# Patient Record
Sex: Female | Born: 1985
Health system: Southern US, Community
[De-identification: ages and names within clinical notes are randomized; demographics above are authoritative.]

## PROBLEM LIST (undated history)

## (undated) ENCOUNTER — Inpatient Hospital Stay (HOSPITAL_COMMUNITY): Payer: Self-pay

## (undated) DIAGNOSIS — G8929 Other chronic pain: Secondary | ICD-10-CM

## (undated) DIAGNOSIS — O24419 Gestational diabetes mellitus in pregnancy, unspecified control: Secondary | ICD-10-CM

## (undated) DIAGNOSIS — R51 Headache: Secondary | ICD-10-CM

## (undated) DIAGNOSIS — R519 Headache, unspecified: Secondary | ICD-10-CM

## (undated) DIAGNOSIS — T7840XA Allergy, unspecified, initial encounter: Secondary | ICD-10-CM

## (undated) DIAGNOSIS — K219 Gastro-esophageal reflux disease without esophagitis: Secondary | ICD-10-CM

## (undated) HISTORY — DX: Other chronic pain: G89.29

## (undated) HISTORY — DX: Headache, unspecified: R51.9

## (undated) HISTORY — DX: Allergy, unspecified, initial encounter: T78.40XA

## (undated) HISTORY — DX: Headache: R51

## (undated) HISTORY — DX: Gastro-esophageal reflux disease without esophagitis: K21.9

## (undated) HISTORY — DX: Gestational diabetes mellitus in pregnancy, unspecified control: O24.419

---

## 2012-09-17 ENCOUNTER — Encounter (HOSPITAL_COMMUNITY): Payer: Self-pay | Admitting: *Deleted

## 2012-09-17 ENCOUNTER — Emergency Department (HOSPITAL_COMMUNITY)
Admission: EM | Admit: 2012-09-17 | Discharge: 2012-09-17 | Disposition: A | Discharge status: Home / Self Care | Payer: Self-pay | Attending: Emergency Medicine | Admitting: Emergency Medicine

## 2012-09-17 DIAGNOSIS — T7840XA Allergy, unspecified, initial encounter: Secondary | ICD-10-CM

## 2012-09-17 DIAGNOSIS — F17200 Nicotine dependence, unspecified, uncomplicated: Secondary | ICD-10-CM | POA: Insufficient documentation

## 2012-09-17 DIAGNOSIS — L250 Unspecified contact dermatitis due to cosmetics: Secondary | ICD-10-CM | POA: Insufficient documentation

## 2012-09-17 MED ORDER — FAMOTIDINE 40 MG PO TABS: 20.0000 mg | ORAL_TABLET | Freq: Two times a day (BID) | ORAL | Status: DC

## 2012-09-17 MED ORDER — FAMOTIDINE 20 MG PO TABS
40.0000 mg | ORAL_TABLET | Freq: Once | ORAL | Status: AC
  Administered 2012-09-17: 40 mg via ORAL
  Filled 2012-09-17: qty 2

## 2012-09-17 MED ORDER — PREDNISONE 20 MG PO TABS: 40.0000 mg | ORAL_TABLET | Freq: Every day | ORAL | Status: DC

## 2012-09-17 MED ORDER — PREDNISONE 20 MG PO TABS
60.0000 mg | ORAL_TABLET | Freq: Once | ORAL | Status: AC
  Administered 2012-09-17: 60 mg via ORAL
  Filled 2012-09-17: qty 3

## 2012-09-17 NOTE — ED Provider Notes (Addendum)
History     CSN: [id number]  Arrival date & time 09/17/12  7829   First MD Initiated Contact with Patient 09/17/12 2131      Chief Complaint  Patient presents with  . Allergic Reaction    (Consider location/radiation/quality/duration/timing/severity/associated sxs/prior treatment) HPI Comments: She broke out with generalized hives, proximally, 4 AM this was after he tripped to the mall with her sister and a sampled.  Several new cosmetics.  She has had a history of a similar type of reaction.  Several years ago.  Patient denies any shortness of breath, difficulty swallowing.  Has not taken any over-the-counter medications prior to arrival  Patient is a 26 y.o. female presenting with allergic reaction. The history is provided by the patient.  Allergic Reaction The primary symptoms are  rash and urticaria. The primary symptoms do not include wheezing, nausea or dizziness.    History reviewed. No pertinent past medical history.  History reviewed. No pertinent past surgical history.  No family history on file.  History  Substance Use Topics  . Smoking status: Current Every Day Smoker    Types: Cigarettes  . Smokeless tobacco: Not on file  . Alcohol Use: No    OB History    Grav Para Term Preterm Abortions TAB SAB Ect Mult Living                  Review of Systems  Constitutional: Negative for fever and chills.  Respiratory: Negative for wheezing.   Gastrointestinal: Negative for nausea.  Skin: Positive for rash. Negative for wound.  Neurological: Negative for dizziness, weakness and headaches.    Allergies  Review of patient's allergies indicates no known allergies.  Home Medications   Current Outpatient Rx  Name Route Sig Dispense Refill  . FAMOTIDINE 40 MG PO TABS Oral Take 0.5 tablets (20 mg total) by mouth 2 (two) times daily. 10 tablet 0  . PREDNISONE 20 MG PO TABS Oral Take 2 tablets (40 mg total) by mouth daily. 10 tablet 0    BP 102/75  Pulse 110   Temp 97.4 F (36.3 C) (Oral)  Resp 18  SpO2 97%  Physical Exam  Constitutional: She appears well-developed and well-nourished.  HENT:  Head: Normocephalic.  Eyes: Pupils are equal, round, and reactive to light.  Neck: Normal range of motion.  Cardiovascular: Normal rate.   Pulmonary/Chest: Effort normal. No respiratory distress. She has no wheezes. She has no rales.  Musculoskeletal: Normal range of motion.  Skin: Skin is warm. Rash noted.    ED Course  Procedures (including critical care time)  Labs Reviewed - No data to display No results found.   1. Allergic reaction       MDM   Visually generalized hives, no shortness of breath, difficulty swallowing.  She was given steroids before my examination, she took 25 mg a Benadryl, but is still having significant, pured, does have given her 40 mg of Pepcid by mouth, which seems to have decreased.  Her itching, the hives are starting to fade        Arman Filter, NP 09/17/12 2136  Arman Filter, NP 09/17/12 2136  Arman Filter, NP 09/17/12 2136  Arman Filter, NP 09/18/12 0110  Arman Filter, NP 09/18/12 0110  Arman Filter, NP 09/19/12 2012

## 2012-09-17 NOTE — ED Notes (Signed)
Pt tried a new perfume yesterday and at 4 am this am pt awoke with hives.  She has taken 25 mg of benadryl x 2 with no relief.  No angioedema, but pt is covered in hives from head to toe.  Pt states she is starting to feel dizzy.  Airway intact.

## 2012-09-17 NOTE — ED Notes (Signed)
Pt is improving with prednisone.

## 2012-09-17 NOTE — ED Notes (Signed)
Pt continues to have hives, however, airway remains clear and pt continues to deny sob.

## 2012-09-18 ENCOUNTER — Emergency Department (HOSPITAL_COMMUNITY)
Admission: EM | Admit: 2012-09-18 | Discharge: 2012-09-18 | Disposition: A | Discharge status: Home / Self Care | Payer: Self-pay | Attending: Emergency Medicine | Admitting: Emergency Medicine

## 2012-09-18 ENCOUNTER — Encounter (HOSPITAL_COMMUNITY): Payer: Self-pay | Admitting: Physical Medicine and Rehabilitation

## 2012-09-18 DIAGNOSIS — L299 Pruritus, unspecified: Secondary | ICD-10-CM | POA: Insufficient documentation

## 2012-09-18 DIAGNOSIS — F17200 Nicotine dependence, unspecified, uncomplicated: Secondary | ICD-10-CM | POA: Insufficient documentation

## 2012-09-18 DIAGNOSIS — R21 Rash and other nonspecific skin eruption: Secondary | ICD-10-CM | POA: Insufficient documentation

## 2012-09-18 DIAGNOSIS — T4995XA Adverse effect of unspecified topical agent, initial encounter: Secondary | ICD-10-CM | POA: Insufficient documentation

## 2012-09-18 DIAGNOSIS — T7840XA Allergy, unspecified, initial encounter: Secondary | ICD-10-CM

## 2012-09-18 MED ORDER — DIPHENHYDRAMINE HCL 50 MG/ML IJ SOLN
50.0000 mg | Freq: Once | INTRAMUSCULAR | Status: AC
  Administered 2012-09-18: 50 mg via INTRAMUSCULAR
  Filled 2012-09-18: qty 1

## 2012-09-18 MED ORDER — HYDROXYZINE HCL 25 MG PO TABS: 25.0000 mg | ORAL_TABLET | Freq: Four times a day (QID) | ORAL | Status: DC

## 2012-09-18 NOTE — ED Notes (Signed)
Pt presents to department for evaluation of generalized rash and hives all over body. Pt states she was seen for same yesterday, but itching and discomfort became worse today. Also states "I feel like my throat is closing up.' received prescriptions for pepcid and prednisone last night. Respirations unlabored at the time. She is alert and oriented x4.

## 2012-09-18 NOTE — ED Provider Notes (Signed)
History   This chart was scribed for Toy Baker, MD by Charolett Bumpers . The patient was seen in room TR09C/TR09C. Patient's care was started at 1825.   CSN: [id number] Arrival date & time 09/18/12  1712  First MD Initiated Contact with Patient 09/18/12 1825      Chief Complaint  Patient presents with  . Rash  . Allergic Reaction    The history is provided by the patient. No language interpreter was used.   Lanny Frakes is a 26 y.o. female who presents to the Emergency Department complaining of constant, moderate generalized itchy rash to most of her body. She was seen here last night for a rash that was thought due to a new perfume. She reports she was given Pepcid and Prednisone last night that she got filled. She denies taking any Benadryl at home but received a benadryl injection her in ED with relief of itching. Husband states the itching worsened earlier today. Husband denies any difficulty breathing or swallowing.   No past medical history on file.  No past surgical history on file.  No family history on file.  History  Substance Use Topics  . Smoking status: Current Every Day Smoker    Types: Cigarettes  . Smokeless tobacco: Not on file  . Alcohol Use: No    OB History    Grav Para Term Preterm Abortions TAB SAB Ect Mult Living                  Review of Systems  Constitutional: Negative for fever and chills.  HENT: Negative for trouble swallowing.   Respiratory: Negative for shortness of breath and stridor.   Gastrointestinal: Negative for nausea and vomiting.  Skin: Positive for rash.  Neurological: Negative for weakness.  All other systems reviewed and are negative.    Allergies  Review of patient's allergies indicates no known allergies.  Home Medications   Current Outpatient Rx  Name Route Sig Dispense Refill  . FAMOTIDINE 40 MG PO TABS Oral Take 0.5 tablets (20 mg total) by mouth 2 (two) times daily. 10 tablet 0  . PREDNISONE 20 MG  PO TABS Oral Take 2 tablets (40 mg total) by mouth daily. 10 tablet 0    BP 125/86  Pulse 100  Temp 98.1 F (36.7 C) (Oral)  Resp 20  SpO2 98%  Physical Exam  Nursing note and vitals reviewed. Constitutional: She is oriented to person, place, and time. She appears well-developed and well-nourished.  Non-toxic appearance. No distress.  HENT:  Head: Normocephalic and atraumatic.  Mouth/Throat: Oropharynx is clear and moist. No oropharyngeal exudate.       Airway appears normal.   Eyes: Conjunctivae normal, EOM and lids are normal. Pupils are equal, round, and reactive to light.  Neck: Normal range of motion. Neck supple. No tracheal deviation present. No mass present.       No stridor.   Cardiovascular: Normal rate, regular rhythm and normal heart sounds.  Exam reveals no gallop.   No murmur heard. Pulmonary/Chest: Effort normal and breath sounds normal. No stridor. No respiratory distress. She has no decreased breath sounds. She has no wheezes. She has no rhonchi. She has no rales.  Abdominal: Soft. Normal appearance and bowel sounds are normal. She exhibits no distension. There is no tenderness. There is no rebound and no CVA tenderness.  Musculoskeletal: Normal range of motion. She exhibits no edema and no tenderness.  Neurological: She is alert and oriented to person, place,  and time. She has normal strength. No cranial nerve deficit or sensory deficit. GCS eye subscore is 4. GCS verbal subscore is 5. GCS motor subscore is 6.  Skin: Skin is warm and dry. Rash noted. No abrasion noted.       Hives on face.   Psychiatric: She has a normal mood and affect. Her speech is normal and behavior is normal.    ED Course  Procedures (including critical care time)  DIAGNOSTIC STUDIES: Oxygen Saturation is 98% on room air, normal by my interpretation.    COORDINATION OF CARE:  18:35-Discussed planned course of treatment with the patient including continuing with Prednisone and Pepcid, who  is agreeable at this time. Will start pt on Hydroxyzine.     Labs Reviewed - No data to display No results found.   No diagnosis found.    MDM  Patient given Benadryl here to 50 mg IM and feels better. Prescribe her Vistaril and given her return instructions   I personally performed the services described in this documentation, which was scribed in my presence. The recorded information has been reviewed and considered.      Toy Baker, MD 09/18/12 [phone]

## 2012-09-19 NOTE — ED Provider Notes (Signed)
Medical screening examination/treatment/procedure(s) were performed by non-physician practitioner and as supervising physician I was immediately available for consultation/collaboration.   Gwyneth Sprout, MD 09/19/12 1324

## 2012-09-20 NOTE — ED Provider Notes (Signed)
Medical screening examination/treatment/procedure(s) were performed by non-physician practitioner and as supervising physician I was immediately available for consultation/collaboration.   Gwyneth Sprout, MD 09/20/12 [phone]

## 2012-09-27 ENCOUNTER — Ambulatory Visit: Payer: Self-pay | Admitting: Family Medicine

## 2012-09-27 VITALS — BP 112/72 | HR 73 | Temp 97.9°F | Resp 16 | Ht 66.5 in | Wt 144.6 lb

## 2012-09-27 DIAGNOSIS — T7840XA Allergy, unspecified, initial encounter: Secondary | ICD-10-CM

## 2012-09-27 MED ORDER — METHYLPREDNISOLONE ACETATE 80 MG/ML IJ SUSP: 80.0000 mg | Freq: Once | INTRAMUSCULAR | Status: DC

## 2012-09-27 MED ORDER — EPINEPHRINE 0.3 MG/0.3ML IJ DEVI: 0.3000 mg | Freq: Once | INTRAMUSCULAR | Status: DC

## 2012-09-27 NOTE — Progress Notes (Signed)
[facility] [address] [place] [address] [phone]  Date:  09/27/2012   Name:  Monique Patrick   DOB:  1986/06/20   MRN:  [id number]  PCP:  Sheila Oats, MD    Chief Complaint: Rash   History of Present Illness:  Monique Patrick is a 26 y.o. very pleasant female patient who presents with the following:  She was seen at the ED on 09/17/12 with an allergic reaction consisting or rash and urticaria. She was noted to have hives and was treated with pepcid and benadryl and an rx for oral prednisone.   She went back to the ED the next day on 10/23 for a recheck.  She was started on hydroxyzine and told to continue her prednisone and pepcid.  She was given a shot of benadryl as well.    She is here with her husband today.  The rash seems to continue- the hives come and go, but they tend to be worse in the morning and at night.  This morning she had a lot of hives. They are gone now.    She took prednisone for 5 days- she finished this about 4 days ago. They were not sure if this helped- she is worse again today.    She had a similar episode when she was 26 years old or so.  However, this was not as severe and resolved without incident Her LMP was about 3 weeks ago.    There is no problem list on file for this patient.   Past Medical History  Diagnosis Date  . Allergy     History reviewed. No pertinent past surgical history.  History  Substance Use Topics  . Smoking status: Never Smoker   . Smokeless tobacco: Not on file  . Alcohol Use: No    History reviewed. No pertinent family history.  No Known Allergies  Medication list has been reviewed and updated.  Current Outpatient Prescriptions on File Prior to Visit  Medication Sig Dispense Refill  . famotidine (PEPCID) 40 MG tablet Take 0.5 tablets (20 mg total) by mouth 2 (two) times daily.  10 tablet  0  . hydrOXYzine (ATARAX/VISTARIL) 25 MG tablet Take 1 tablet (25 mg total) by mouth every 6  (six) hours.  12 tablet  0  . predniSONE (DELTASONE) 20 MG tablet Take 2 tablets (40 mg total) by mouth daily.  10 tablet  0    Review of Systems:  As per HPI- otherwise negative. They have photos of her from this morning with diffuse urticaria over her body  Physical Examination: Filed Vitals:   09/27/12 1327  BP: 112/72  Pulse: 73  Temp: 97.9 F (36.6 C)  Resp: 16   Filed Vitals:   09/27/12 1327  Height: 5' 6.5" (1.689 m)  Weight: 144 lb 9.6 oz (65.59 kg)   Body mass index is 22.99 kg/(m^2). Ideal Body Weight: Weight in (lb) to have BMI = 25: 156.9   GEN: WDWN, NAD, Non-toxic, A & O x 3, wearing traditional African dress of full- length dress and head- wrap.   HEENT: Atraumatic, Normocephalic. Neck supple. No masses, No LAD. Bilateral TM wnl, oropharynx normal.  PEERL,EOMI.  No lip, tongue or palate swelling.  No sign of angioedema.  Ears and Nose: No external deformity. CV: RRR, No M/G/R. No JVD. No thrill. No extra heart sounds. PULM: CTA B, no wheezes, crackles, rhonchi. No retractions. No resp. distress. No accessory muscle use. ABD: S, NT, ND  EXTR: No c/c/e NEURO Normal gait.  PSYCH: Normally interactive. Conversant. Not depressed or anxious appearing.  Calm demeanor.  At this time she has a few small hives on her right thigh but no other active hives on her limbs or trunk   Assessment and Plan: 1. Allergic reaction  methylPREDNISolone acetate (DEPO-MEDROL) injection 80 mg, EPINEPHrine (EPIPEN) 0.3 mg/0.3 mL DEVI   Allergic reaction, unknown cause.  She is frustrated by her persistent symptoms.  No known trigger.  Will try IM depo- medrol.  Otherwise continue anti- histamine treatment as they are doing.  Discussed use of an epi- pen and gave an rx to have on hand.    Abbe Amsterdam, MD

## 2012-09-27 NOTE — Patient Instructions (Addendum)
Let us know if your hives do not resolve in the next few days.  Your might try taking these things: Benadryl 1 or 2 pills every 6 hours as needed (use this OR the hydralazine)  One pepcid a day One claritin or zyrtec a day  Stay cool and calm.   If you have any swelling of your lips, tongue or throat and are having trouble breathing use the epi- pen.  If this occurs you also need to seek care right away- call 911!   If you have any concerns you can always call us

## 2012-10-28 ENCOUNTER — Telehealth: Payer: Self-pay

## 2012-10-28 NOTE — Telephone Encounter (Signed)
Left message for call back.

## 2012-10-28 NOTE — Telephone Encounter (Signed)
[name] STATES HIS WIFE WAS SEEN FOR ALLERGIES AND WAS TOLD TO CALL IF NO BETTER AND SHE IS WORSE PLEASE CALL [phone]

## 2012-10-28 NOTE — Telephone Encounter (Signed)
Please give them a call back- it sounds like they are doing everything they can at home.  She may need another shot and possibly a referral to see an allergist.  Can they come back in?

## 2012-10-28 NOTE — Telephone Encounter (Signed)
PTS HUSBAND CALLING TO SAY SPOUSE HAS ALLERGIES(RASH)BACK AND WAS TOLD TO CALL IF THAT HAPPENED.  BEST PHONE [phone]  PHARMACY [facility]

## 2012-10-28 NOTE — Telephone Encounter (Signed)
Pts husband is calling back to talk with nurse about his wife

## 2012-10-28 NOTE — Telephone Encounter (Signed)
Was seen 09/27/12., she has rash everywhere, including her face, same as before. Now is getting worse. She is taking OTC meds, husband states she is taking Benadryl. I have advised to get Zyrtec to add to this. He said he would, please advise if there is anything else you would like, has been 1 month since last OV.

## 2012-10-28 NOTE — Telephone Encounter (Signed)
Called back- no answer but was able to leave a message.  LMOM stating that it sound like his wife needs to be seen, either by [place] or at the ED.

## 2012-10-30 ENCOUNTER — Ambulatory Visit: Payer: Self-pay | Admitting: Family Medicine

## 2012-10-30 VITALS — BP 102/64 | HR 90 | Temp 97.6°F | Resp 16 | Ht 66.0 in | Wt 142.0 lb

## 2012-10-30 DIAGNOSIS — L509 Urticaria, unspecified: Secondary | ICD-10-CM

## 2012-10-30 DIAGNOSIS — Z609 Problem related to social environment, unspecified: Secondary | ICD-10-CM

## 2012-10-30 DIAGNOSIS — Z79899 Other long term (current) drug therapy: Secondary | ICD-10-CM

## 2012-10-30 DIAGNOSIS — T7840XA Allergy, unspecified, initial encounter: Secondary | ICD-10-CM

## 2012-10-30 DIAGNOSIS — Z789 Other specified health status: Secondary | ICD-10-CM

## 2012-10-30 HISTORY — DX: Urticaria, unspecified: L50.9

## 2012-10-30 LAB — GLUCOSE, POCT (MANUAL RESULT ENTRY): POC Glucose: 91 mg/dl (ref 70–99)

## 2012-10-30 MED ORDER — METHYLPREDNISOLONE ACETATE 80 MG/ML IJ SUSP
80.0000 mg | Freq: Once | INTRAMUSCULAR | Status: AC
  Administered 2012-10-30: 80 mg via INTRAMUSCULAR

## 2012-10-30 MED ORDER — HYDROXYZINE HCL 25 MG PO TABS: 25.0000 mg | ORAL_TABLET | Freq: Four times a day (QID) | ORAL | Status: DC | PRN

## 2012-10-30 MED ORDER — FAMOTIDINE 40 MG PO TABS: 20.0000 mg | ORAL_TABLET | Freq: Two times a day (BID) | ORAL | Status: DC

## 2012-10-30 NOTE — Progress Notes (Signed)
Subjective:    Patient ID: Monique Patrick, female    DOB: 1985-12-26, 26 y.o.   MRN: [id number]  HPI Monique Patrick is a 26 y.o. female  Here with recurrnet pruritic rash.  eval in ER 10/22, then 10/23. Pepcid, atarax, prednisone treatment.  Recurrent 09/27/12 - OV with Dr. Patsy Lager. Depomedrol injection given.  Phone notes reviewed - here for follow up.   Same as before - but current episode seems worse. Started all over body - 6 days ago.  Gets a stomachache initially, then itching started. Feels like going to have a bowel mvmt, but does not. abd pain - still some soreness in abdomen.  All over body - including inside mouth, and redness of eyes.   Itching all over.  No difficulty breathing - but does note sore throat since start of symptoms.  Itching around outside of vagina, but no internal rash.  No dyspnea. Areas in mouth have resolved - none now. Redness and hives on face initially.   Doesn't want to walk because itching on feet. Bump on foot not gone since initial symptoms in October.  tx - did not bring meds - using over the counter cream - anti itch cream of some type. Also taking some liquicap - past 6 days - 2 or 3 per day.  Unknown name of this medicine.  Wasn't helping - no meds since yesterday. No new rx meds/supplements, creams or lotions.   Language barrier - husband translating. Repeated questions multiple times for clarification.   Using water only to bathe.  vaseline only for itchy.   Henna tattoo to feet past 20 days  - never had problem with this in past. Natural substance.  Though may be due to wheat cereal - noted symptoms 1 day after eating a cake with possible wheat in it.   Review of Systems  Eyes: Positive for redness and itching.  Gastrointestinal: Negative for abdominal pain (had prior - none now. ).  Skin: Positive for rash.       Objective:   Physical Exam  Constitutional: She is oriented to person, place, and time. She appears well-developed and  well-nourished.  HENT:  Head: Normocephalic and atraumatic.  Right Ear: External ear normal.  Left Ear: External ear normal.  Mouth/Throat: Uvula is midline, oropharynx is clear and moist and mucous membranes are normal. No oropharyngeal exudate, posterior oropharyngeal edema, posterior oropharyngeal erythema or tonsillar abscesses.       No mucosal lesions identified.   Eyes: Conjunctivae normal and EOM are normal. Pupils are equal, round, and reactive to light. Right eye exhibits no discharge. Left eye exhibits no discharge.  Cardiovascular: Normal rate, regular rhythm, normal heart sounds and intact distal pulses.   Pulmonary/Chest: Effort normal and breath sounds normal.  Abdominal: Soft. Bowel sounds are normal. There is no tenderness.  Neurological: She is alert and oriented to person, place, and time.  Skin: Skin is warm and dry. Rash noted. Rash is urticarial.     Psychiatric: She has a normal mood and affect. Her behavior is normal.   Results for orders placed in visit on 10/30/12  GLUCOSE, POCT (MANUAL RESULT ENTRY)      Component Value Range   POC Glucose 91  70 - 99 mg/dl        Assessment & Plan:  Monique Patrick is a 26 y.o. female 1. Urticaria  Ambulatory referral to Allergy  2. High risk medication use  POCT glucose (manual entry)   Recurrent urticaria - allergic  rxn vs idiopathic urticaria.  Abdominal symptoms likely histamine related. nontender on exam, and no mucosal lesions noted.   Restart pepcid, hydroxyzine, depomedrol injection given (discussed course of prednisone but reported better relief with injection last visit than prednisone pills initially prescribed). Refer to allergist.   RTC and ER precautions discussed.   Patient Instructions  We will refer you to an allergist to evaluate why these hives keep returning.  Take the pepcid twice per day, and the hydroxyzine up to every 6 hours as needed for itching.  Avoid wheat products for now until evaluated by  the specialist.  If your hives do not improve with the injection in the next 2 days, or they return after going away - you may need to be on a longer course of steroids to treat this.  Return to the clinic or go to the nearest emergency room if any of your symptoms worsen or new symptoms occur. If any new ulcers or new rash inside the mouth or in genital area - be evaluated here or in the emergency room right away.  Hives Hives are itchy, red, swollen areas of the skin. They can vary in size and location on your body. Hives can come and go for hours or several days (acute hives) or for several weeks (chronic hives). Hives do not spread from person to person (noncontagious). They may get worse with scratching, exercise, and emotional stress. CAUSES   Allergic reaction to food, additives, or drugs.  Infections, including the common cold.  Illness, such as vasculitis, lupus, or thyroid disease.  Exposure to sunlight, heat, or cold.  Exercise.  Stress.  Contact with chemicals. SYMPTOMS   Red or white swollen patches on the skin. The patches may change size, shape, and location quickly and repeatedly.  Itching.  Swelling of the hands, feet, and face. This may occur if hives develop deeper in the skin. DIAGNOSIS  Your caregiver can usually tell what is wrong by performing a physical exam. Skin or blood tests may also be done to determine the cause of your hives. In some cases, the cause cannot be determined. TREATMENT  Mild cases usually get better with medicines such as antihistamines. Severe cases may require an emergency epinephrine injection. If the cause of your hives is known, treatment includes avoiding that trigger.  HOME CARE INSTRUCTIONS   Avoid causes that trigger your hives.  Take antihistamines as directed by your caregiver to reduce the severity of your hives. Non-sedating or low-sedating antihistamines are usually recommended. Do not drive while taking an  antihistamine.  Take any other medicines prescribed for itching as directed by your caregiver.  Wear loose-fitting clothing.  Keep all follow-up appointments as directed by your caregiver. SEEK MEDICAL CARE IF:   You have persistent or severe itching that is not relieved with medicine.  You have painful or swollen joints. SEEK IMMEDIATE MEDICAL CARE IF:   You have a fever.  Your tongue or lips are swollen.  You have trouble breathing or swallowing.  You feel tightness in the throat or chest.  You have abdominal pain. These problems may be the first sign of a life-threatening allergic reaction. Call your local emergency services (911 in [place] MAKE SURE YOU:   Understand these instructions.  Will watch your condition.  Will get help right away if you are not doing well or get worse. Document Released: 11/13/2005 Document Revised: 05/14/2012 Document Reviewed: 02/06/2012 [facility] Patient Information 2013 [place], [place].

## 2012-10-30 NOTE — Patient Instructions (Addendum)
We will refer you to an allergist to evaluate why these hives keep returning.  Take the pepcid twice per day, and the hydroxyzine up to every 6 hours as needed for itching.  Avoid wheat products for now until evaluated by the specialist.  If your hives do not improve with the injection in the next 2 days, or they return after going away - you may need to be on a longer course of steroids to treat this.  Return to the clinic or go to the nearest emergency room if any of your symptoms worsen or new symptoms occur. If any new ulcers or new rash inside the mouth or in genital area - be evaluated here or in the emergency room right away.  Hives Hives are itchy, red, swollen areas of the skin. They can vary in size and location on your body. Hives can come and go for hours or several days (acute hives) or for several weeks (chronic hives). Hives do not spread from person to person (noncontagious). They may get worse with scratching, exercise, and emotional stress. CAUSES   Allergic reaction to food, additives, or drugs.  Infections, including the common cold.  Illness, such as vasculitis, lupus, or thyroid disease.  Exposure to sunlight, heat, or cold.  Exercise.  Stress.  Contact with chemicals. SYMPTOMS   Red or white swollen patches on the skin. The patches may change size, shape, and location quickly and repeatedly.  Itching.  Swelling of the hands, feet, and face. This may occur if hives develop deeper in the skin. DIAGNOSIS  Your caregiver can usually tell what is wrong by performing a physical exam. Skin or blood tests may also be done to determine the cause of your hives. In some cases, the cause cannot be determined. TREATMENT  Mild cases usually get better with medicines such as antihistamines. Severe cases may require an emergency epinephrine injection. If the cause of your hives is known, treatment includes avoiding that trigger.  HOME CARE INSTRUCTIONS   Avoid causes that  trigger your hives.  Take antihistamines as directed by your caregiver to reduce the severity of your hives. Non-sedating or low-sedating antihistamines are usually recommended. Do not drive while taking an antihistamine.  Take any other medicines prescribed for itching as directed by your caregiver.  Wear loose-fitting clothing.  Keep all follow-up appointments as directed by your caregiver. SEEK MEDICAL CARE IF:   You have persistent or severe itching that is not relieved with medicine.  You have painful or swollen joints. SEEK IMMEDIATE MEDICAL CARE IF:   You have a fever.  Your tongue or lips are swollen.  You have trouble breathing or swallowing.  You feel tightness in the throat or chest.  You have abdominal pain. These problems may be the first sign of a life-threatening allergic reaction. Call your local emergency services (911 in [place] MAKE SURE YOU:   Understand these instructions.  Will watch your condition.  Will get help right away if you are not doing well or get worse. Document Released: 11/13/2005 Document Revised: 05/14/2012 Document Reviewed: 02/06/2012 [facility] Patient Information 2013 [place], [place].

## 2013-11-27 NOTE — L&D Delivery Note (Signed)
Attestation of Attending Supervision of Advanced Practitioner (PA/CNM/NP): Evaluation and management procedures were performed by the Advanced Practitioner under my supervision and collaboration.  I have reviewed the Advanced Practitioner's note and chart, and I agree with the management and plan.  Keileigh Vahey, MD, FACOG Attending Obstetrician & Gynecologist Faculty Practice, [facility]   

## 2013-11-27 NOTE — L&D Delivery Note (Signed)
Delivery Note At 6:48 PM a viable female was delivered via  (Presentation: OA>LOA ).  APGAR: 8, 4; weight 8 lb 10 oz (3912 g).   Placenta status: , .  Cord: 3 vessels with the following complications:  NN team called at 2 min due to bradycardia. NG suction done  Anesthesia: None  Episiotomy: None Lacerations: 2nd degree Suture Repair: vicryl rapide Est. Blood Loss (mL): 400  Mom to postpartum.  Baby to Couplet care / Skin to Skin.  Monique Patrick 08/08/2014, 7:29 PM

## 2013-12-12 ENCOUNTER — Ambulatory Visit (INDEPENDENT_AMBULATORY_CARE_PROVIDER_SITE_OTHER): Payer: BC Managed Care – PPO

## 2013-12-12 DIAGNOSIS — Z3480 Encounter for supervision of other normal pregnancy, unspecified trimester: Secondary | ICD-10-CM

## 2013-12-12 DIAGNOSIS — Z3689 Encounter for other specified antenatal screening: Secondary | ICD-10-CM

## 2013-12-12 DIAGNOSIS — Z3201 Encounter for pregnancy test, result positive: Secondary | ICD-10-CM

## 2013-12-12 DIAGNOSIS — Z3490 Encounter for supervision of normal pregnancy, unspecified, unspecified trimester: Secondary | ICD-10-CM

## 2013-12-12 LAB — OB RESULTS CONSOLE HIV ANTIBODY (ROUTINE TESTING): HIV: NONREACTIVE

## 2013-12-12 LAB — OB RESULTS CONSOLE PLATELET COUNT: Platelets: 391 10*3/uL

## 2013-12-12 LAB — OB RESULTS CONSOLE RPR: RPR: NONREACTIVE

## 2013-12-12 LAB — POCT PREGNANCY, URINE: PREG TEST UR: POSITIVE — AB

## 2013-12-12 LAB — OB RESULTS CONSOLE HGB/HCT, BLOOD
HEMATOCRIT: 36 %
Hemoglobin: 12 g/dL

## 2013-12-12 NOTE — Progress Notes (Signed)
Pt here for pregnancy test.  Positive pregnancy test.  LMP 10/27/13 according to patient. Pt is 6w 3d today. [facility]  Will schedule lab work without pap with cultures and urine culture.  Scheduled anatomy US for April 13th.

## 2013-12-13 LAB — OBSTETRIC PANEL
ANTIBODY SCREEN: NEGATIVE
Basophils Absolute: 0 10*3/uL (ref 0.0–0.1)
Basophils Relative: 0 % (ref 0–1)
Eosinophils Absolute: 0.2 10*3/uL (ref 0.0–0.7)
Eosinophils Relative: 2 % (ref 0–5)
HEMATOCRIT: 36.1 % (ref 36.0–46.0)
HEMOGLOBIN: 12 g/dL (ref 12.0–15.0)
Hepatitis B Surface Ag: NEGATIVE
LYMPHS PCT: 31 % (ref 12–46)
Lymphs Abs: 2.8 10*3/uL (ref 0.7–4.0)
MCH: 28.8 pg (ref 26.0–34.0)
MCHC: 33.2 g/dL (ref 30.0–36.0)
MCV: 86.8 fL (ref 78.0–100.0)
MONO ABS: 0.6 10*3/uL (ref 0.1–1.0)
MONOS PCT: 7 % (ref 3–12)
NEUTROS ABS: 5.5 10*3/uL (ref 1.7–7.7)
Neutrophils Relative %: 60 % (ref 43–77)
Platelets: 391 10*3/uL (ref 150–400)
RBC: 4.16 MIL/uL (ref 3.87–5.11)
RDW: 12.8 % (ref 11.5–15.5)
RH TYPE: POSITIVE
RUBELLA: 17.1 {index} — AB (ref <0.90)
WBC: 9.1 10*3/uL (ref 4.0–10.5)

## 2013-12-13 LAB — HIV ANTIBODY (ROUTINE TESTING W REFLEX): HIV: NONREACTIVE

## 2013-12-15 LAB — PRESCRIPTION MONITORING PROFILE (19 PANEL)
Amphetamine/Meth: NEGATIVE ng/mL
BARBITURATE SCREEN, URINE: NEGATIVE ng/mL
BENZODIAZEPINE SCREEN, URINE: NEGATIVE ng/mL
Buprenorphine, Urine: NEGATIVE ng/mL
Cannabinoid Scrn, Ur: NEGATIVE ng/mL
Carisoprodol, Urine: NEGATIVE ng/mL
Cocaine Metabolites: NEGATIVE ng/mL
Creatinine, Urine: 420.13 mg/dL (ref >20.0)
Fentanyl, Ur: NEGATIVE ng/mL
MDMA URINE: NEGATIVE ng/mL
MEPERIDINE UR: NEGATIVE ng/mL
Methadone Screen, Urine: NEGATIVE ng/mL
Methaqualone: NEGATIVE ng/mL
NITRITES URINE, INITIAL: NEGATIVE ug/mL
OPIATE SCREEN, URINE: NEGATIVE ng/mL
Oxycodone Screen, Ur: NEGATIVE ng/mL
PH URINE, INITIAL: 5.8 pH (ref 4.5–8.9)
PROPOXYPHENE: NEGATIVE ng/mL
Phencyclidine, Ur: NEGATIVE ng/mL
TRAMADOL UR: NEGATIVE ng/mL
Tapentadol, urine: NEGATIVE ng/mL
ZOLPIDEM, URINE: NEGATIVE ng/mL

## 2013-12-16 LAB — HEMOGLOBINOPATHY EVALUATION
Hemoglobin Other: 0 %
Hgb A2 Quant: 2.7 % (ref 2.2–3.2)
Hgb A: 97 % (ref 96.8–97.8)
Hgb F Quant: 0.3 % (ref 0.0–2.0)
Hgb S Quant: 0 %

## 2014-01-21 ENCOUNTER — Encounter: Payer: Self-pay | Admitting: Advanced Practice Midwife

## 2014-01-21 ENCOUNTER — Ambulatory Visit (INDEPENDENT_AMBULATORY_CARE_PROVIDER_SITE_OTHER): Payer: Medicaid Other | Admitting: Advanced Practice Midwife

## 2014-01-21 VITALS — BP 107/68 | Temp 97.9°F | Wt 149.2 lb

## 2014-01-21 DIAGNOSIS — Z1389 Encounter for screening for other disorder: Secondary | ICD-10-CM

## 2014-01-21 DIAGNOSIS — Z3687 Encounter for antenatal screening for uncertain dates: Secondary | ICD-10-CM

## 2014-01-21 DIAGNOSIS — Z23 Encounter for immunization: Secondary | ICD-10-CM

## 2014-01-21 DIAGNOSIS — Z3480 Encounter for supervision of other normal pregnancy, unspecified trimester: Secondary | ICD-10-CM

## 2014-01-21 LAB — POCT URINALYSIS DIP (DEVICE)
Bilirubin Urine: NEGATIVE
Glucose, UA: NEGATIVE mg/dL
KETONES UR: NEGATIVE mg/dL
LEUKOCYTES UA: NEGATIVE
Nitrite: NEGATIVE
PH: 5.5 (ref 5.0–8.0)
PROTEIN: NEGATIVE mg/dL
Specific Gravity, Urine: >=1.03 (ref 1.005–1.030)
Urobilinogen, UA: 0.2 mg/dL (ref 0.0–1.0)

## 2014-01-21 MED ORDER — PRENATAL VITAMINS PLUS 27-1 MG PO TABS: 1.0000 | ORAL_TABLET | Freq: Every day | ORAL | Status: DC

## 2014-01-21 NOTE — Progress Notes (Signed)
   Subjective:    Genia Hotterhssan Level is a G2P1001 136w2d being seen today for her first obstetrical visit.  Her obstetrical history is significant for NSVD x1. Patient does intend to breast feed. Pregnancy history fully reviewed.  Patient reports no complaints.  Filed Vitals:   01/21/14 0850  BP: 107/68  Temp: 97.9 F (36.6 C)  Weight: 149 lb 3.2 oz (67.677 kg)    HISTORY: OB History  Gravida Para Term Preterm AB SAB TAB Ectopic Multiple Living  2 1 1       1     # Outcome Date GA Lbr Len/2nd Weight Sex Delivery Anes PTL Lv  2 CUR           1 TRM 05/28/11 2727w0d   F SVD None  Y     Comments: No complications, born [place]     Past Medical History  Diagnosis Date  . Allergy    History reviewed. No pertinent past surgical history. Family History  Problem Relation Age of Onset  . Diabetes Father   . Diabetes Maternal Grandmother   . Diabetes Paternal Grandfather      Exam    Uterus:     Pelvic Exam:    Perineum: No Hemorrhoids, Normal Perineum   Vulva: normal   Vagina:  normal mucosa, normal discharge   pH:    Cervix: no bleeding following Pap, no cervical motion tenderness and no lesions   Adnexa: normal adnexa and no mass, fullness, tenderness   Bony Pelvis: average  System: Breast:  normal appearance, no masses or tenderness   Skin: normal coloration and turgor, no rashes    Neurologic: oriented, normal, gait normal; reflexes normal and symmetric   Extremities: normal strength, tone, and muscle mass, ROM of all joints is normal   HEENT neck supple with midline trachea and thyroid without masses   Mouth/Teeth mucous membranes moist, pharynx normal without lesions and dental hygiene good   Neck supple and no masses   Cardiovascular: regular rate and rhythm   Respiratory:  appears well, vitals normal, no respiratory distress, acyanotic, normal RR, ear and throat exam is normal, neck free of mass or lymphadenopathy, chest clear, no wheezing, crepitations, rhonchi,  normal symmetric air entry   Abdomen: soft, non-tender; bowel sounds normal; no masses,  no organomegaly   Urinary: urethral meatus normal      Assessment:    Pregnancy: G2P1001 Patient Active Problem List   Diagnosis Date Noted  . Supervision of normal subsequent pregnancy 01/21/2014  . Urticaria 10/30/2012        Plan:     Initial labs reviewed. Prenatal vitamins. Problem list reviewed and updated. Genetic Screening discussed First Screen and Quad Screen: declined.  Ultrasound discussed; fetal survey: requested.  Follow up in 4 weeks. 50% of 30 min visit spent on counseling and coordination of care.     LEFTWICH-KIRBY, LISA 01/21/2014

## 2014-01-21 NOTE — Progress Notes (Signed)
P=81,   Here for new ob appt.  States LMP she thinks was December 1st, but may have been another day close to that in the first week of December.  [employer] tradernterpreter . Given new patient information.  Discussed bmi/ appropriate weight gain.

## 2014-01-28 ENCOUNTER — Ambulatory Visit (HOSPITAL_COMMUNITY)
Admission: RE | Admit: 2014-01-28 | Discharge: 2014-01-28 | Disposition: A | Discharge status: Home / Self Care | Payer: Medicaid Other | Source: Ambulatory Visit | Attending: Advanced Practice Midwife | Admitting: Advanced Practice Midwife

## 2014-01-28 DIAGNOSIS — Z3689 Encounter for other specified antenatal screening: Secondary | ICD-10-CM | POA: Insufficient documentation

## 2014-01-28 DIAGNOSIS — Z3687 Encounter for antenatal screening for uncertain dates: Secondary | ICD-10-CM

## 2014-01-30 ENCOUNTER — Encounter: Payer: Self-pay | Admitting: Advanced Practice Midwife

## 2014-02-18 ENCOUNTER — Encounter: Payer: Self-pay | Admitting: Family

## 2014-03-10 ENCOUNTER — Ambulatory Visit (HOSPITAL_COMMUNITY)
Admission: RE | Admit: 2014-03-10 | Discharge: 2014-03-10 | Disposition: A | Discharge status: Home / Self Care | Payer: Medicaid Other | Source: Ambulatory Visit | Attending: Obstetrics & Gynecology | Admitting: Obstetrics & Gynecology

## 2014-03-10 DIAGNOSIS — Z3689 Encounter for other specified antenatal screening: Secondary | ICD-10-CM

## 2014-03-11 ENCOUNTER — Encounter: Payer: Self-pay | Admitting: Obstetrics & Gynecology

## 2014-03-17 ENCOUNTER — Encounter: Payer: Self-pay | Admitting: Advanced Practice Midwife

## 2014-03-18 ENCOUNTER — Encounter: Payer: Self-pay | Admitting: Family Medicine

## 2014-04-07 ENCOUNTER — Ambulatory Visit (INDEPENDENT_AMBULATORY_CARE_PROVIDER_SITE_OTHER): Payer: Medicaid Other | Admitting: Obstetrics and Gynecology

## 2014-04-07 ENCOUNTER — Encounter: Payer: Self-pay | Admitting: Obstetrics and Gynecology

## 2014-04-07 ENCOUNTER — Encounter: Payer: Self-pay | Admitting: Obstetrics & Gynecology

## 2014-04-07 VITALS — BP 93/58 | HR 90 | Temp 98.0°F | Wt 156.7 lb

## 2014-04-07 DIAGNOSIS — Z3480 Encounter for supervision of other normal pregnancy, unspecified trimester: Secondary | ICD-10-CM

## 2014-04-07 LAB — POCT URINALYSIS DIP (DEVICE)
Bilirubin Urine: NEGATIVE
Glucose, UA: NEGATIVE mg/dL
HGB URINE DIPSTICK: NEGATIVE
Ketones, ur: NEGATIVE mg/dL
Leukocytes, UA: NEGATIVE
Nitrite: NEGATIVE
PH: 6 (ref 5.0–8.0)
PROTEIN: NEGATIVE mg/dL
UROBILINOGEN UA: 0.2 mg/dL (ref 0.0–1.0)

## 2014-04-07 NOTE — Patient Instructions (Addendum)
Second Trimester of Pregnancy The second trimester is from week 13 through week 28, months 4 through 6. The second trimester is often a time when you feel your best. Your body has also adjusted to being pregnant, and you begin to feel better physically. Usually, morning sickness has lessened or quit completely, you may have more energy, and you may have an increase in appetite. The second trimester is also a time when the fetus is growing rapidly. At the end of the sixth month, the fetus is about 9 inches long and weighs about 1 pounds. You will likely begin to feel the baby move (quickening) between 18 and 20 weeks of the pregnancy. BODY CHANGES Your body goes through many changes during pregnancy. The changes vary from woman to woman.   Your weight will continue to increase. You will notice your lower abdomen bulging out.  You may begin to get stretch marks on your hips, abdomen, and breasts.  You may develop headaches that can be relieved by medicines approved by your caregiver.  You may urinate more often because the fetus is pressing on your bladder.  You may develop or continue to have heartburn as a result of your pregnancy.  You may develop constipation because certain hormones are causing the muscles that push waste through your intestines to slow down.  You may develop hemorrhoids or swollen, bulging veins (varicose veins).  You may have back pain because of the weight gain and pregnancy hormones relaxing your joints between the bones in your pelvis and as a result of a shift in weight and the muscles that support your balance.  Your breasts will continue to grow and be tender.  Your gums may bleed and may be sensitive to brushing and flossing.  Dark spots or blotches (chloasma, mask of pregnancy) may develop on your face. This will likely fade after the baby is born.  A dark line from your belly button to the pubic area (linea nigra) may appear. This will likely fade after the  baby is born. WHAT TO EXPECT AT YOUR PRENATAL VISITS During a routine prenatal visit:  You will be weighed to make sure you and the fetus are growing normally.  Your blood pressure will be taken.  Your abdomen will be measured to track your baby's growth.  The fetal heartbeat will be listened to.  Any test results from the previous visit will be discussed. Your caregiver may ask you:  How you are feeling.  If you are feeling the baby move.  If you have had any abnormal symptoms, such as leaking fluid, bleeding, severe headaches, or abdominal cramping.  If you have any questions. Other tests that may be performed during your second trimester include:  Blood tests that check for:  Low iron levels (anemia).  Gestational diabetes (between 24 and 28 weeks).  Rh antibodies.  Urine tests to check for infections, diabetes, or protein in the urine.  An ultrasound to confirm the proper growth and development of the baby.  An amniocentesis to check for possible genetic problems.  Fetal screens for spina bifida and Down syndrome. HOME CARE INSTRUCTIONS   Avoid all smoking, herbs, alcohol, and unprescribed drugs. These chemicals affect the formation and growth of the baby.  Follow your caregiver's instructions regarding medicine use. There are medicines that are either safe or unsafe to take during pregnancy.  Exercise only as directed by your caregiver. Experiencing uterine cramps is a good sign to stop exercising.  Continue to eat regular,   healthy meals.  Wear a good support bra for breast tenderness.  Do not use hot tubs, steam rooms, or saunas.  Wear your seat belt at all times when driving.  Avoid raw meat, uncooked cheese, cat litter boxes, and soil used by cats. These carry germs that can cause birth defects in the baby.  Take your prenatal vitamins.  Try taking a stool softener (if your caregiver approves) if you develop constipation. Eat more high-fiber foods,  such as fresh vegetables or fruit and whole grains. Drink plenty of fluids to keep your urine clear or pale yellow.  Take warm sitz baths to soothe any pain or discomfort caused by hemorrhoids. Use hemorrhoid cream if your caregiver approves.  If you develop varicose veins, wear support hose. Elevate your feet for 15 minutes, 3 4 times a day. Limit salt in your diet.  Avoid heavy lifting, wear low heel shoes, and practice good posture.  Rest with your legs elevated if you have leg cramps or low back pain.  Visit your dentist if you have not gone yet during your pregnancy. Use a soft toothbrush to brush your teeth and be gentle when you floss.  A sexual relationship may be continued unless your caregiver directs you otherwise.  Continue to go to all your prenatal visits as directed by your caregiver. SEEK MEDICAL CARE IF:   You have dizziness.  You have mild pelvic cramps, pelvic pressure, or nagging pain in the abdominal area.  You have persistent nausea, vomiting, or diarrhea.  You have a bad smelling vaginal discharge.  You have pain with urination. SEEK IMMEDIATE MEDICAL CARE IF:   You have a fever.  You are leaking fluid from your vagina.  You have spotting or bleeding from your vagina.  You have severe abdominal cramping or pain.  You have rapid weight gain or loss.  You have shortness of breath with chest pain.  You notice sudden or extreme swelling of your face, hands, ankles, feet, or legs.  You have not felt your baby move in over an hour.  You have severe headaches that do not go away with medicine.  You have vision changes. Document Released: 11/07/2001 Document Revised: 07/16/2013 Document Reviewed: 01/14/2013 [facility] Patient Information 2014 [place], [place]. Insomnia Insomnia is frequent trouble falling and/or staying asleep. Insomnia can be a long term problem or a short term problem. Both are common. Insomnia can be a short term problem when the  wakefulness is related to a certain stress or worry. Long term insomnia is often related to ongoing stress during waking hours and/or poor sleeping habits. Overtime, sleep deprivation itself can make the problem worse. Every little thing feels more severe because you are overtired and your ability to cope is decreased. CAUSES   Stress, anxiety, and depression.  Poor sleeping habits.  Distractions such as TV in the bedroom.  Naps close to bedtime.  Engaging in emotionally charged conversations before bed.  Technical reading before sleep.  Alcohol and other sedatives. They may make the problem worse. They can hurt normal sleep patterns and normal dream activity.  Stimulants such as caffeine for several hours prior to bedtime.  Pain syndromes and shortness of breath can cause insomnia.  Exercise late at night.  Changing time zones may cause sleeping problems (jet lag). It is sometimes helpful to have someone observe your sleeping patterns. They should look for periods of not breathing during the night (sleep apnea). They should also look to see how long those periods last.  If you live alone or observers are uncertain, you can also be observed at a sleep clinic where your sleep patterns will be professionally monitored. Sleep apnea requires a checkup and treatment. Give your caregivers your medical history. Give your caregivers observations your family has made about your sleep.  SYMPTOMS   Not feeling rested in the morning.  Anxiety and restlessness at bedtime.  Difficulty falling and staying asleep. TREATMENT   Your caregiver may prescribe treatment for an underlying medical disorders. Your caregiver can give advice or help if you are using alcohol or other drugs for self-medication. Treatment of underlying problems will usually eliminate insomnia problems.  Medications can be prescribed for short time use. They are generally not recommended for lengthy use.  Over-the-counter  sleep medicines are not recommended for lengthy use. They can be habit forming.  You can promote easier sleeping by making lifestyle changes such as:  Using relaxation techniques that help with breathing and reduce muscle tension.  Exercising earlier in the day.  Changing your diet and the time of your last meal. No night time snacks.  Establish a regular time to go to bed.  Counseling can help with stressful problems and worry.  Soothing music and white noise may be helpful if there are background noises you cannot remove.  Stop tedious detailed work at least one hour before bedtime. HOME CARE INSTRUCTIONS   Keep a diary. Inform your caregiver about your progress. This includes any medication side effects. See your caregiver regularly. Take note of:  Times when you are asleep.  Times when you are awake during the night.  The quality of your sleep.  How you feel the next day. This information will help your caregiver care for you.  Get out of bed if you are still awake after 15 minutes. Read or do some quiet activity. Keep the lights down. Wait until you feel sleepy and go back to bed.  Keep regular sleeping and waking hours. Avoid naps.  Exercise regularly.  Avoid distractions at bedtime. Distractions include watching television or engaging in any intense or detailed activity like attempting to balance the household checkbook.  Develop a bedtime ritual. Keep a familiar routine of bathing, brushing your teeth, climbing into bed at the same time each night, listening to soothing music. Routines increase the success of falling to sleep faster.  Use relaxation techniques. This can be using breathing and muscle tension release routines. It can also include visualizing peaceful scenes. You can also help control troubling or intruding thoughts by keeping your mind occupied with boring or repetitive thoughts like the old concept of counting sheep. You can make it more creative like  imagining planting one beautiful flower after another in your backyard garden.  During your day, work to eliminate stress. When this is not possible use some of the previous suggestions to help reduce the anxiety that accompanies stressful situations. MAKE SURE YOU:   Understand these instructions.  Will watch your condition.  Will get help right away if you are not doing well or get worse. Document Released: 11/10/2000 Document Revised: 02/05/2012 Document Reviewed: 12/11/2007 [facility] Patient Information 2014 [place], [place]. Contraception Choices Contraception (birth control) is the use of any methods or devices to prevent pregnancy. Below are some methods to help avoid pregnancy. HORMONAL METHODS   Contraceptive implant This is a thin, plastic tube containing progesterone hormone. It does not contain estrogen hormone. Your health care provider inserts the tube in the inner part of the upper arm.  The tube can remain in place for up to 3 years. After 3 years, the implant must be removed. The implant prevents the ovaries from releasing an egg (ovulation), thickens the cervical mucus to prevent sperm from entering the uterus, and thins the lining of the inside of the uterus.  Progesterone-only injections These injections are given every 3 months by your health care provider to prevent pregnancy. This synthetic progesterone hormone stops the ovaries from releasing eggs. It also thickens cervical mucus and changes the uterine lining. This makes it harder for sperm to survive in the uterus.  Birth control pills These pills contain estrogen and progesterone hormone. They work by preventing the ovaries from releasing eggs (ovulation). They also cause the cervical mucus to thicken, preventing the sperm from entering the uterus. Birth control pills are prescribed by a health care provider.Birth control pills can also be used to treat heavy periods.  Minipill This type of birth control pill contains  only the progesterone hormone. They are taken every day of each month and must be prescribed by your health care provider.  Birth control patch The patch contains hormones similar to those in birth control pills. It must be changed once a week and is prescribed by a health care provider.  Vaginal ring The ring contains hormones similar to those in birth control pills. It is left in the vagina for 3 weeks, removed for 1 week, and then a new one is put back in place. The patient must be comfortable inserting and removing the ring from the vagina.A health care provider's prescription is necessary.  Emergency contraception Emergency contraceptives prevent pregnancy after unprotected sexual intercourse. This pill can be taken right after sex or up to 5 days after unprotected sex. It is most effective the sooner you take the pills after having sexual intercourse. Most emergency contraceptive pills are available without a prescription. Check with your pharmacist. Do not use emergency contraception as your only form of birth control. BARRIER METHODS   Female condom This is a thin sheath (latex or rubber) that is worn over the penis during sexual intercourse. It can be used with spermicide to increase effectiveness.  Female condom. This is a soft, loose-fitting sheath that is put into the vagina before sexual intercourse.  Diaphragm This is a soft, latex, dome-shaped barrier that must be fitted by a health care provider. It is inserted into the vagina, along with a spermicidal jelly. It is inserted before intercourse. The diaphragm should be left in the vagina for 6 to 8 hours after intercourse.  Cervical cap This is a round, soft, latex or plastic cup that fits over the cervix and must be fitted by a health care provider. The cap can be left in place for up to 48 hours after intercourse.  Sponge This is a soft, circular piece of polyurethane foam. The sponge has spermicide in it. It is inserted into the  vagina after wetting it and before sexual intercourse.  Spermicides These are chemicals that kill or block sperm from entering the cervix and uterus. They come in the form of creams, jellies, suppositories, foam, or tablets. They do not require a prescription. They are inserted into the vagina with an applicator before having sexual intercourse. The process must be repeated every time you have sexual intercourse. INTRAUTERINE CONTRACEPTION  Intrauterine device (IUD) This is a T-shaped device that is put in a woman's uterus during a menstrual period to prevent pregnancy. There are 2 types:  Copper IUD This type  of IUD is wrapped in copper wire and is placed inside the uterus. Copper makes the uterus and fallopian tubes produce a fluid that kills sperm. It can stay in place for 10 years.  Hormone IUD This type of IUD contains the hormone progestin (synthetic progesterone). The hormone thickens the cervical mucus and prevents sperm from entering the uterus, and it also thins the uterine lining to prevent implantation of a fertilized egg. The hormone can weaken or kill the sperm that get into the uterus. It can stay in place for 3 5 years, depending on which type of IUD is used. PERMANENT METHODS OF CONTRACEPTION  Female tubal ligation This is when the woman's fallopian tubes are surgically sealed, tied, or blocked to prevent the egg from traveling to the uterus.  Hysteroscopic sterilization This involves placing a small coil or insert into each fallopian tube. Your doctor uses a technique called hysteroscopy to do the procedure. The device causes scar tissue to form. This results in permanent blockage of the fallopian tubes, so the sperm cannot fertilize the egg. It takes about 3 months after the procedure for the tubes to become blocked. You must use another form of birth control for these 3 months.  Female sterilization This is when the female has the tubes that carry sperm tied off (vasectomy).This  blocks sperm from entering the vagina during sexual intercourse. After the procedure, the man can still ejaculate fluid (semen). NATURAL PLANNING METHODS  Natural family planning This is not having sexual intercourse or using a barrier method (condom, diaphragm, cervical cap) on days the woman could become pregnant.  Calendar method This is keeping track of the length of each menstrual cycle and identifying when you are fertile.  Ovulation method This is avoiding sexual intercourse during ovulation.  Symptothermal method This is avoiding sexual intercourse during ovulation, using a thermometer and ovulation symptoms.  Post ovulation method This is timing sexual intercourse after you have ovulated. Regardless of which type or method of contraception you choose, it is important that you use condoms to protect against the transmission of sexually transmitted infections (STIs). Talk with your health care provider about which form of contraception is most appropriate for you. Document Released: 11/13/2005 Document Revised: 07/16/2013 Document Reviewed: 05/08/2013 [facility] Patient Information 2014 [place], [place].

## 2014-04-07 NOTE — Progress Notes (Signed)
No complaints today.

## 2014-04-07 NOTE — Progress Notes (Signed)
Interpreter here. Doing well except difficulty sleeping. Discussed exercise and sleep hygiene. Benadyl 25 mg at hs if needed. US anatomy scheduled. Undecided re contraception> info given.

## 2014-04-10 ENCOUNTER — Ambulatory Visit (HOSPITAL_COMMUNITY): Admission: RE | Admit: 2014-04-10 | Payer: Medicaid Other | Source: Ambulatory Visit

## 2014-04-15 ENCOUNTER — Ambulatory Visit (HOSPITAL_COMMUNITY)
Admission: RE | Admit: 2014-04-15 | Discharge: 2014-04-15 | Disposition: A | Discharge status: Home / Self Care | Payer: Medicaid Other | Source: Ambulatory Visit | Attending: Obstetrics and Gynecology | Admitting: Obstetrics and Gynecology

## 2014-04-15 DIAGNOSIS — Z3480 Encounter for supervision of other normal pregnancy, unspecified trimester: Secondary | ICD-10-CM

## 2014-04-15 DIAGNOSIS — Z3689 Encounter for other specified antenatal screening: Secondary | ICD-10-CM | POA: Insufficient documentation

## 2014-04-22 ENCOUNTER — Encounter: Payer: Self-pay | Admitting: General Practice

## 2014-04-24 ENCOUNTER — Encounter: Payer: Self-pay | Admitting: General Practice

## 2014-05-05 ENCOUNTER — Ambulatory Visit (INDEPENDENT_AMBULATORY_CARE_PROVIDER_SITE_OTHER): Payer: Medicaid Other | Admitting: Obstetrics and Gynecology

## 2014-05-05 ENCOUNTER — Encounter: Payer: Self-pay | Admitting: Obstetrics and Gynecology

## 2014-05-05 VITALS — BP 116/65 | HR 92 | Temp 98.5°F | Wt 167.1 lb

## 2014-05-05 DIAGNOSIS — Z23 Encounter for immunization: Secondary | ICD-10-CM

## 2014-05-05 DIAGNOSIS — Z3480 Encounter for supervision of other normal pregnancy, unspecified trimester: Secondary | ICD-10-CM

## 2014-05-05 LAB — POCT URINALYSIS DIP (DEVICE)
Bilirubin Urine: NEGATIVE
Glucose, UA: NEGATIVE mg/dL
HGB URINE DIPSTICK: NEGATIVE
KETONES UR: NEGATIVE mg/dL
Leukocytes, UA: NEGATIVE
Nitrite: NEGATIVE
PH: 7 (ref 5.0–8.0)
PROTEIN: NEGATIVE mg/dL
SPECIFIC GRAVITY, URINE: 1.025 (ref 1.005–1.030)
UROBILINOGEN UA: 1 mg/dL (ref 0.0–1.0)

## 2014-05-05 LAB — CBC
HEMATOCRIT: 32.2 % — AB (ref 36.0–46.0)
Hemoglobin: 10.7 g/dL — ABNORMAL LOW (ref 12.0–15.0)
MCH: 29 pg (ref 26.0–34.0)
MCHC: 33.2 g/dL (ref 30.0–36.0)
MCV: 87.3 fL (ref 78.0–100.0)
Platelets: 287 10*3/uL (ref 150–400)
RBC: 3.69 MIL/uL — ABNORMAL LOW (ref 3.87–5.11)
RDW: 13.3 % (ref 11.5–15.5)
WBC: 8 10*3/uL (ref 4.0–10.5)

## 2014-05-05 MED ORDER — TETANUS-DIPHTH-ACELL PERTUSSIS 5-2.5-18.5 LF-MCG/0.5 IM SUSP: 0.5000 mL | Freq: Once | INTRAMUSCULAR | Status: DC

## 2014-05-05 NOTE — Patient Instructions (Signed)
Second Trimester of Pregnancy The second trimester is from week 13 through week 28, months 4 through 6. The second trimester is often a time when you feel your best. Your body has also adjusted to being pregnant, and you begin to feel better physically. Usually, morning sickness has lessened or quit completely, you may have more energy, and you may have an increase in appetite. The second trimester is also a time when the fetus is growing rapidly. At the end of the sixth month, the fetus is about 9 inches long and weighs about 1 pounds. You will likely begin to feel the baby move (quickening) between 18 and 20 weeks of the pregnancy. BODY CHANGES Your body goes through many changes during pregnancy. The changes vary from woman to woman.   Your weight will continue to increase. You will notice your lower abdomen bulging out.  You may begin to get stretch marks on your hips, abdomen, and breasts.  You may develop headaches that can be relieved by medicines approved by your caregiver.  You may urinate more often because the fetus is pressing on your bladder.  You may develop or continue to have heartburn as a result of your pregnancy.  You may develop constipation because certain hormones are causing the muscles that push waste through your intestines to slow down.  You may develop hemorrhoids or swollen, bulging veins (varicose veins).  You may have back pain because of the weight gain and pregnancy hormones relaxing your joints between the bones in your pelvis and as a result of a shift in weight and the muscles that support your balance.  Your breasts will continue to grow and be tender.  Your gums may bleed and may be sensitive to brushing and flossing.  Dark spots or blotches (chloasma, mask of pregnancy) may develop on your face. This will likely fade after the baby is born.  A dark line from your belly button to the pubic area (linea nigra) may appear. This will likely fade after the  baby is born. WHAT TO EXPECT AT YOUR PRENATAL VISITS During a routine prenatal visit:  You will be weighed to make sure you and the fetus are growing normally.  Your blood pressure will be taken.  Your abdomen will be measured to track your baby's growth.  The fetal heartbeat will be listened to.  Any test results from the previous visit will be discussed. Your caregiver may ask you:  How you are feeling.  If you are feeling the baby move.  If you have had any abnormal symptoms, such as leaking fluid, bleeding, severe headaches, or abdominal cramping.  If you have any questions. Other tests that may be performed during your second trimester include:  Blood tests that check for:  Low iron levels (anemia).  Gestational diabetes (between 24 and 28 weeks).  Rh antibodies.  Urine tests to check for infections, diabetes, or protein in the urine.  An ultrasound to confirm the proper growth and development of the baby.  An amniocentesis to check for possible genetic problems.  Fetal screens for spina bifida and Down syndrome. HOME CARE INSTRUCTIONS   Avoid all smoking, herbs, alcohol, and unprescribed drugs. These chemicals affect the formation and growth of the baby.  Follow your caregiver's instructions regarding medicine use. There are medicines that are either safe or unsafe to take during pregnancy.  Exercise only as directed by your caregiver. Experiencing uterine cramps is a good sign to stop exercising.  Continue to eat regular,   healthy meals.  Wear a good support bra for breast tenderness.  Do not use hot tubs, steam rooms, or saunas.  Wear your seat belt at all times when driving.  Avoid raw meat, uncooked cheese, cat litter boxes, and soil used by cats. These carry germs that can cause birth defects in the baby.  Take your prenatal vitamins.  Try taking a stool softener (if your caregiver approves) if you develop constipation. Eat more high-fiber foods,  such as fresh vegetables or fruit and whole grains. Drink plenty of fluids to keep your urine clear or pale yellow.  Take warm sitz baths to soothe any pain or discomfort caused by hemorrhoids. Use hemorrhoid cream if your caregiver approves.  If you develop varicose veins, wear support hose. Elevate your feet for 15 minutes, 3 4 times a day. Limit salt in your diet.  Avoid heavy lifting, wear low heel shoes, and practice good posture.  Rest with your legs elevated if you have leg cramps or low back pain.  Visit your dentist if you have not gone yet during your pregnancy. Use a soft toothbrush to brush your teeth and be gentle when you floss.  A sexual relationship may be continued unless your caregiver directs you otherwise.  Continue to go to all your prenatal visits as directed by your caregiver. SEEK MEDICAL CARE IF:   You have dizziness.  You have mild pelvic cramps, pelvic pressure, or nagging pain in the abdominal area.  You have persistent nausea, vomiting, or diarrhea.  You have a bad smelling vaginal discharge.  You have pain with urination. SEEK IMMEDIATE MEDICAL CARE IF:   You have a fever.  You are leaking fluid from your vagina.  You have spotting or bleeding from your vagina.  You have severe abdominal cramping or pain.  You have rapid weight gain or loss.  You have shortness of breath with chest pain.  You notice sudden or extreme swelling of your face, hands, ankles, feet, or legs.  You have not felt your baby move in over an hour.  You have severe headaches that do not go away with medicine.  You have vision changes. Document Released: 11/07/2001 Document Revised: 07/16/2013 Document Reviewed: 01/14/2013 ExitCare Patient Information 2014 [employer].  

## 2014-05-05 NOTE — Progress Notes (Signed)
Reviewed nl. anatomoic scan. Sleeping better. Good FM. 1 hr OGTT, labs today.

## 2014-05-06 LAB — HIV ANTIBODY (ROUTINE TESTING W REFLEX): HIV 1&2 Ab, 4th Generation: NONREACTIVE

## 2014-05-06 LAB — RPR

## 2014-05-06 LAB — GLUCOSE TOLERANCE, 1 HOUR (50G) W/O FASTING: GLUCOSE 1 HOUR GTT: 108 mg/dL (ref 70–140)

## 2014-06-02 ENCOUNTER — Encounter: Payer: Medicaid Other | Admitting: Obstetrics and Gynecology

## 2014-06-02 ENCOUNTER — Telehealth: Payer: Self-pay | Admitting: Obstetrics and Gynecology

## 2014-06-02 NOTE — Telephone Encounter (Signed)
Called patient to inform her of a missed appointment. Interpreter left message for her to call back and reschedule.

## 2014-06-22 ENCOUNTER — Ambulatory Visit (INDEPENDENT_AMBULATORY_CARE_PROVIDER_SITE_OTHER): Payer: Medicaid Other | Admitting: Obstetrics & Gynecology

## 2014-06-22 VITALS — BP 103/59 | HR 89 | Temp 98.5°F | Wt 176.1 lb

## 2014-06-22 DIAGNOSIS — Z3480 Encounter for supervision of other normal pregnancy, unspecified trimester: Secondary | ICD-10-CM

## 2014-06-22 DIAGNOSIS — Z3483 Encounter for supervision of other normal pregnancy, third trimester: Secondary | ICD-10-CM

## 2014-06-22 LAB — POCT URINALYSIS DIP (DEVICE)
Bilirubin Urine: NEGATIVE
Glucose, UA: NEGATIVE mg/dL
HGB URINE DIPSTICK: NEGATIVE
Leukocytes, UA: NEGATIVE
NITRITE: NEGATIVE
PH: 6.5 (ref 5.0–8.0)
Protein, ur: NEGATIVE mg/dL
SPECIFIC GRAVITY, URINE: 1.025 (ref 1.005–1.030)
UROBILINOGEN UA: 1 mg/dL (ref 0.0–1.0)

## 2014-06-22 NOTE — Patient Instructions (Addendum)
Return to clinic for any obstetric concerns or go to MAU for evaluation  You can take Benadryl at night, 1 - 2 capsules/tablets

## 2014-06-22 NOTE — Progress Notes (Signed)
Normal 1 hr 108. No other complaints or concerns.  Fetal movement and labor precautions reviewed.

## 2014-07-06 ENCOUNTER — Encounter: Payer: Medicaid Other | Admitting: Family Medicine

## 2014-07-20 ENCOUNTER — Ambulatory Visit (INDEPENDENT_AMBULATORY_CARE_PROVIDER_SITE_OTHER): Payer: Medicaid Other | Admitting: Obstetrics and Gynecology

## 2014-07-20 VITALS — BP 107/68 | HR 90 | Temp 97.9°F | Wt 188.7 lb

## 2014-07-20 DIAGNOSIS — Z3480 Encounter for supervision of other normal pregnancy, unspecified trimester: Secondary | ICD-10-CM

## 2014-07-20 DIAGNOSIS — Z3483 Encounter for supervision of other normal pregnancy, third trimester: Secondary | ICD-10-CM

## 2014-07-20 LAB — POCT URINALYSIS DIP (DEVICE)
Bilirubin Urine: NEGATIVE
Glucose, UA: NEGATIVE mg/dL
HGB URINE DIPSTICK: NEGATIVE
KETONES UR: NEGATIVE mg/dL
Leukocytes, UA: NEGATIVE
Nitrite: NEGATIVE
PROTEIN: NEGATIVE mg/dL
SPECIFIC GRAVITY, URINE: 1.015 (ref 1.005–1.030)
UROBILINOGEN UA: 0.2 mg/dL (ref 0.0–1.0)
pH: 6 (ref 5.0–8.0)

## 2014-07-20 LAB — OB RESULTS CONSOLE GC/CHLAMYDIA
Chlamydia: NEGATIVE
Gonorrhea: NEGATIVE

## 2014-07-20 LAB — OB RESULTS CONSOLE GBS: GBS: NEGATIVE

## 2014-07-20 NOTE — Progress Notes (Signed)
Doing well. No concerns today.  1. Routine PNC. GBS, GC/CT sent today.

## 2014-07-20 NOTE — Progress Notes (Signed)
Edema in feet/ankles.  GBS and cultures today.  Reports intermittent pelvic and contractions.

## 2014-07-21 LAB — GC/CHLAMYDIA PROBE AMP
CT Probe RNA: NEGATIVE
GC Probe RNA: NEGATIVE

## 2014-07-22 LAB — CULTURE, BETA STREP (GROUP B ONLY)

## 2014-07-27 ENCOUNTER — Encounter: Payer: Medicaid Other | Admitting: Obstetrics and Gynecology

## 2014-08-08 ENCOUNTER — Encounter (HOSPITAL_COMMUNITY): Payer: Self-pay | Admitting: *Deleted

## 2014-08-08 ENCOUNTER — Inpatient Hospital Stay (HOSPITAL_COMMUNITY)
Admission: AD | Admit: 2014-08-08 | Discharge: 2014-08-10 | DRG: 775 | Disposition: A | Discharge status: Home / Self Care | Payer: Medicaid Other | Source: Ambulatory Visit | Attending: Obstetrics & Gynecology | Admitting: Obstetrics & Gynecology

## 2014-08-08 DIAGNOSIS — Z3480 Encounter for supervision of other normal pregnancy, unspecified trimester: Secondary | ICD-10-CM

## 2014-08-08 DIAGNOSIS — Z833 Family history of diabetes mellitus: Secondary | ICD-10-CM | POA: Diagnosis not present

## 2014-08-08 DIAGNOSIS — O479 False labor, unspecified: Secondary | ICD-10-CM | POA: Diagnosis present

## 2014-08-08 DIAGNOSIS — IMO0001 Reserved for inherently not codable concepts without codable children: Secondary | ICD-10-CM

## 2014-08-08 LAB — CBC
HCT: 32.9 % — ABNORMAL LOW (ref 36.0–46.0)
Hemoglobin: 10.3 g/dL — ABNORMAL LOW (ref 12.0–15.0)
MCH: 26.5 pg (ref 26.0–34.0)
MCHC: 31.3 g/dL (ref 30.0–36.0)
MCV: 84.8 fL (ref 78.0–100.0)
Platelets: 285 10*3/uL (ref 150–400)
RBC: 3.88 MIL/uL (ref 3.87–5.11)
RDW: 15.4 % (ref 11.5–15.5)
WBC: 8 10*3/uL (ref 4.0–10.5)

## 2014-08-08 MED ORDER — PRENATAL MULTIVITAMIN CH: 1.0000 | ORAL_TABLET | Freq: Every day | ORAL | Status: DC

## 2014-08-08 MED ORDER — ONDANSETRON HCL 4 MG/2ML IJ SOLN: 4.0000 mg | Freq: Four times a day (QID) | INTRAMUSCULAR | Status: DC | PRN

## 2014-08-08 MED ORDER — TETANUS-DIPHTH-ACELL PERTUSSIS 5-2.5-18.5 LF-MCG/0.5 IM SUSP
0.5000 mL | Freq: Once | INTRAMUSCULAR | Status: DC
  Filled 2014-08-08: qty 0.5

## 2014-08-08 MED ORDER — BENZOCAINE-MENTHOL 20-0.5 % EX AERO
1.0000 "application " | INHALATION_SPRAY | CUTANEOUS | Status: DC | PRN
  Filled 2014-08-08: qty 56

## 2014-08-08 MED ORDER — ONDANSETRON HCL 4 MG PO TABS: 4.0000 mg | ORAL_TABLET | ORAL | Status: DC | PRN

## 2014-08-08 MED ORDER — DIBUCAINE 1 % RE OINT
1.0000 "application " | TOPICAL_OINTMENT | RECTAL | Status: DC | PRN
  Filled 2014-08-08: qty 28

## 2014-08-08 MED ORDER — OXYCODONE-ACETAMINOPHEN 5-325 MG PO TABS: 1.0000 | ORAL_TABLET | ORAL | Status: DC | PRN

## 2014-08-08 MED ORDER — OXYTOCIN 40 UNITS IN LACTATED RINGERS INFUSION - SIMPLE MED
62.5000 mL/h | INTRAVENOUS | Status: DC
  Administered 2014-08-08: 62.5 mL/h via INTRAVENOUS
  Filled 2014-08-08: qty 1000

## 2014-08-08 MED ORDER — IBUPROFEN 600 MG PO TABS
600.0000 mg | ORAL_TABLET | Freq: Four times a day (QID) | ORAL | Status: DC
  Administered 2014-08-08: 600 mg via ORAL
  Filled 2014-08-08: qty 1

## 2014-08-08 MED ORDER — BENZOCAINE-MENTHOL 20-0.5 % EX AERO: 1.0000 "application " | INHALATION_SPRAY | CUTANEOUS | Status: DC | PRN

## 2014-08-08 MED ORDER — IBUPROFEN 600 MG PO TABS: 600.0000 mg | ORAL_TABLET | Freq: Four times a day (QID) | ORAL | Status: DC

## 2014-08-08 MED ORDER — FENTANYL CITRATE 0.05 MG/ML IJ SOLN
INTRAMUSCULAR | Status: AC
  Administered 2014-08-08: 100 ug
  Filled 2014-08-08: qty 2

## 2014-08-08 MED ORDER — LANOLIN HYDROUS EX OINT: TOPICAL_OINTMENT | CUTANEOUS | Status: DC | PRN

## 2014-08-08 MED ORDER — EPHEDRINE 5 MG/ML INJ: 10.0000 mg | INTRAVENOUS | Status: DC | PRN

## 2014-08-08 MED ORDER — DIPHENHYDRAMINE HCL 25 MG PO CAPS
25.0000 mg | ORAL_CAPSULE | Freq: Four times a day (QID) | ORAL | Status: DC | PRN
Start: 2014-08-08 — End: 2014-08-08

## 2014-08-08 MED ORDER — ACETAMINOPHEN 325 MG PO TABS: 650.0000 mg | ORAL_TABLET | ORAL | Status: DC | PRN

## 2014-08-08 MED ORDER — OXYCODONE-ACETAMINOPHEN 5-325 MG PO TABS: 2.0000 | ORAL_TABLET | ORAL | Status: DC | PRN

## 2014-08-08 MED ORDER — FENTANYL 2.5 MCG/ML BUPIVACAINE 1/10 % EPIDURAL INFUSION (WH - ANES)
14.0000 mL/h | INTRAMUSCULAR | Status: DC | PRN
  Filled 2014-08-08: qty 125

## 2014-08-08 MED ORDER — MISOPROSTOL 200 MCG PO TABS
ORAL_TABLET | ORAL | Status: AC
  Filled 2014-08-08: qty 4

## 2014-08-08 MED ORDER — ZOLPIDEM TARTRATE 5 MG PO TABS: 5.0000 mg | ORAL_TABLET | Freq: Every evening | ORAL | Status: DC | PRN

## 2014-08-08 MED ORDER — OXYTOCIN BOLUS FROM INFUSION: 500.0000 mL | INTRAVENOUS | Status: DC

## 2014-08-08 MED ORDER — FENTANYL CITRATE 0.05 MG/ML IJ SOLN
100.0000 ug | Freq: Once | INTRAMUSCULAR | Status: AC
  Administered 2014-08-08: 100 ug via INTRAVENOUS

## 2014-08-08 MED ORDER — SIMETHICONE 80 MG PO CHEW: 80.0000 mg | CHEWABLE_TABLET | ORAL | Status: DC | PRN

## 2014-08-08 MED ORDER — LACTATED RINGERS IV SOLN: 500.0000 mL | INTRAVENOUS | Status: DC | PRN

## 2014-08-08 MED ORDER — OXYCODONE-ACETAMINOPHEN 5-325 MG PO TABS
1.0000 | ORAL_TABLET | ORAL | Status: DC | PRN
  Administered 2014-08-08: 1 via ORAL
  Filled 2014-08-08: qty 1

## 2014-08-08 MED ORDER — WITCH HAZEL-GLYCERIN EX PADS: 1.0000 "application " | MEDICATED_PAD | CUTANEOUS | Status: DC | PRN

## 2014-08-08 MED ORDER — DIPHENHYDRAMINE HCL 50 MG/ML IJ SOLN: 12.5000 mg | INTRAMUSCULAR | Status: DC | PRN

## 2014-08-08 MED ORDER — LIDOCAINE HCL (PF) 1 % IJ SOLN
30.0000 mL | INTRAMUSCULAR | Status: DC | PRN
  Administered 2014-08-08: 30 mL via SUBCUTANEOUS
  Filled 2014-08-08: qty 30

## 2014-08-08 MED ORDER — DIPHENHYDRAMINE HCL 25 MG PO CAPS: 25.0000 mg | ORAL_CAPSULE | Freq: Four times a day (QID) | ORAL | Status: DC | PRN

## 2014-08-08 MED ORDER — LACTATED RINGERS IV SOLN: 500.0000 mL | Freq: Once | INTRAVENOUS | Status: DC

## 2014-08-08 MED ORDER — SENNOSIDES-DOCUSATE SODIUM 8.6-50 MG PO TABS: 2.0000 | ORAL_TABLET | ORAL | Status: DC

## 2014-08-08 MED ORDER — PHENYLEPHRINE 40 MCG/ML (10ML) SYRINGE FOR IV PUSH (FOR BLOOD PRESSURE SUPPORT): 80.0000 ug | PREFILLED_SYRINGE | INTRAVENOUS | Status: DC | PRN

## 2014-08-08 MED ORDER — LACTATED RINGERS IV SOLN: INTRAVENOUS | Status: DC

## 2014-08-08 MED ORDER — MISOPROSTOL 200 MCG PO TABS
800.0000 ug | ORAL_TABLET | ORAL | Status: AC
  Administered 2014-08-08: 800 ug via RECTAL

## 2014-08-08 MED ORDER — DIBUCAINE 1 % RE OINT: 1.0000 "application " | TOPICAL_OINTMENT | RECTAL | Status: DC | PRN

## 2014-08-08 MED ORDER — CITRIC ACID-SODIUM CITRATE 334-500 MG/5ML PO SOLN: 30.0000 mL | ORAL | Status: DC | PRN

## 2014-08-08 MED ORDER — ONDANSETRON HCL 4 MG/2ML IJ SOLN: 4.0000 mg | INTRAMUSCULAR | Status: DC | PRN

## 2014-08-08 MED ORDER — TETANUS-DIPHTH-ACELL PERTUSSIS 5-2.5-18.5 LF-MCG/0.5 IM SUSP: 0.5000 mL | Freq: Once | INTRAMUSCULAR | Status: DC

## 2014-08-08 MED ORDER — FENTANYL CITRATE 0.05 MG/ML IJ SOLN
100.0000 ug | Freq: Once | INTRAMUSCULAR | Status: AC
  Administered 2014-08-08: 100 ug via INTRAVENOUS
  Filled 2014-08-08: qty 2

## 2014-08-08 MED ORDER — PHENYLEPHRINE 40 MCG/ML (10ML) SYRINGE FOR IV PUSH (FOR BLOOD PRESSURE SUPPORT)
80.0000 ug | PREFILLED_SYRINGE | INTRAVENOUS | Status: DC | PRN
  Filled 2014-08-08: qty 10

## 2014-08-08 NOTE — MAU Provider Note (Signed)
Chief Complaint:  Labor Eval      HPI: Monique Patrick is a 28 y.o. G2P1001 at [redacted]w[redacted]d who presents to maternity admissions reporting painful UCs since early AM today. Denies  leakage of fluid or vaginal bleeding. Good fetal movement.   Pregnancy Course: [facility] essentially uncomplicated  Past Medical History: Past Medical History  Diagnosis Date  . Allergy     Past obstetric history: OB History  Gravida Para Term Preterm AB SAB TAB Ectopic Multiple Living  # Outcome Date GA Lbr Len/2nd Weight Sex Delivery Anes PTL Lv  2 CUR           1 TRM 05/28/11 [redacted]w[redacted]d   F SVD None  Y     Comments: No complications, born [place]      Past Surgical History: History reviewed. No pertinent past surgical history.   Family History: Family History  Problem Relation Age of Onset  . Diabetes Father   . Diabetes Maternal Grandmother   . Diabetes Paternal Grandfather     Social History: History  Substance Use Topics  . Smoking status: Never Smoker   . Smokeless tobacco: Never Used  . Alcohol Use: No    Allergies: No Known Allergies  Meds:  Facility-administered medications prior to admission  Medication Dose Route Frequency Provider Last Rate Last Dose  . Tdap (BOOSTRIX) injection 0.5 mL  0.5 mL Intramuscular Once [name], CNM       Prescriptions prior to admission  Medication Sig Dispense Refill  . Prenatal Vit-Fe Fumarate-FA (PRENATAL VITAMINS PLUS) 27-1 MG TABS Take 1 tablet by mouth daily.  30 tablet  12    ROS: Pertinent findings in history of present illness.  Physical Exam  Blood pressure 118/55, pulse 91, temperature 98.2 F (36.8 C), temperature source Oral, resp. rate 18, height  (1.651 m), weight 87.635 kg (193 lb 3.2 oz), last menstrual period 10/27/2013. GENERAL: Well-developed, well-nourished female in no acute distress. Appears uncomfortable with UCs HEENT: normocephalic HEART: normal rate RESP: normal effort ABDOMEN: Soft, non-tender,  gravid appropriate for gestational age EXTREMITIES: Nontender, no edema NEURO: alert and oriented  SVE: 4/70/-1 BBOW per Olegario Messier RN  FHT:  Baseline 130 , moderate variability, accelerations present, no decelerations Contractions: q 4-5 mins   Labs: No results found for this or any previous visit (from the past 24 hour(s)).  Imaging:  No results found. MAU Course: Observed. Cx change noted on recheck  Assessment: 1. Active labor at term   G2P1001 at 3944d Cat 1 FHR  Plan: Admit for expectant management  [name], CNM 08/08/2014 5:03 PM

## 2014-08-08 NOTE — MAU Note (Signed)
C/o ctx since early this morning. Denies SROM or bleeding and reports good fetal movement

## 2014-08-08 NOTE — Progress Notes (Signed)
Delivery of live viable female by D. Poe, CNM. APGARS 8, 4. Infant inhaled copious amount of amniotic fluid immediately following delivery. NICU requested to bedside

## 2014-08-08 NOTE — H&P (Signed)
Attestation of Attending Supervision of Advanced Practitioner (PA/CNM/NP): Evaluation and management procedures were performed by the Advanced Practitioner under my supervision and collaboration.  I have reviewed the Advanced Practitioner's note and chart, and I agree with the management and plan.  Jameila Keeny, MD, FACOG Attending Obstetrician & Gynecologist Faculty Practice, [facility]   

## 2014-08-08 NOTE — H&P (Signed)
Monique Patrick is a 28 y.o. female presenting for labor at term. Maternal Medical History:  Reason for admission: Contractions.  Nausea.  Contractions: Onset was 6-12 hours ago.   Frequency: regular.   Perceived severity is strong.    Fetal activity: Perceived fetal activity is normal.    Prenatal complications: No bleeding.   Prenatal Complications - Diabetes: none.    OB History   Grav Para Term Preterm Abortions TAB SAB Ect Mult Living   Past Medical History  Diagnosis Date  . Allergy    History reviewed. No pertinent past surgical history. Family History: family history includes Diabetes in her father, maternal grandmother, and paternal grandfather. Social History:  reports that she has never smoked. She has never used smokeless tobacco. She reports that she does not drink alcohol or use illicit drugs.   Prenatal Transfer Tool  Maternal Diabetes: No Genetic Screening: Normal Maternal Ultrasounds/Referrals: Normal Fetal Ultrasounds or other Referrals:  None Maternal Substance Abuse:  No Significant Maternal Medications:  None Significant Maternal Lab Results:  Lab values include: Group B Strep negative Other Comments:  None  Review of Systems  Constitutional: Negative.  Negative for fever.  Respiratory: Negative for cough.   Gastrointestinal: Positive for abdominal pain. Negative for nausea and vomiting.  Genitourinary: Negative for dysuria.  Neurological: Positive for dizziness. Negative for headaches.    Dilation: Lip/rim Effacement (%): 100 Station: +1 Exam by:: L.McDaniel RN Blood pressure 134/59, pulse 77, temperature 98 F (36.7 C), temperature source Oral, resp. rate 20, height  (1.651 m), weight 87.635 kg (193 lb 3.2 oz), last menstrual period 10/27/2013, unknown if currently breastfeeding. Maternal Exam:  Uterine Assessment: Contraction strength is firm.  Contraction frequency is regular.   Abdomen: Estimated fetal weight is  7#.   Fetal presentation: vertex  Introitus: Normal vulva. Normal vagina.  Ferning test: not done.  Nitrazine test: not done. Amniotic fluid character: not assessed.  Pelvis: adequate for delivery.   Cervix: Cervix evaluated by digital exam.     Fetal Exam Fetal Monitor Review: Mode: ultrasound.   Baseline rate: 140.  Variability: moderate (6-25 bpm).   Pattern: accelerations present and no decelerations.    Fetal State Assessment: Category I - tracings are normal.     Filed Vitals:   08/08/14 1516 08/08/14 1813  BP: 118/55 134/59  Pulse: 91 77  Temp: 98.2 F (36.8 C) 98 F (36.7 C)  TempSrc: Oral Oral  Resp: 18 20  Height:  (1.651 m)   Weight: 87.635 kg (193 lb 3.2 oz)    Physical Exam  Nursing note and vitals reviewed. Constitutional: She is oriented to person, place, and time. She appears well-developed and well-nourished. She appears distressed.  HENT:  Head: Normocephalic.  Neck: Normal range of motion. Neck supple. No thyromegaly present.  Cardiovascular: Normal rate and regular rhythm.   Respiratory: Effort normal and breath sounds normal.  GI: Soft. There is no tenderness.  Genitourinary: Vagina normal.  Musculoskeletal: Normal range of motion.  Neurological: She is alert and oriented to person, place, and time.  Skin: Skin is warm and dry.    Prenatal labs: ABO, Rh: O/POS/-- (01/16 1121) Antibody: NEG (01/16 1121) Rubella: 17.10 (01/16 1121) RPR: NON REAC (06/09 1416)  HBsAg: NEGATIVE (01/16 1121)  HIV: NONREACTIVE (06/09 1416)  GBS: Negative (08/24 0000)  1 hr glucola 108  Assessment/Plan: Term labor.    Lonald Troiani 08/08/2014, 7:33  PM Note:  H&P done after delivery. Cx change from 4 cm to 7cm while in triage. After arrival in BS, Fentanyl 100 mg IV given, followed shortly after by SROM> clear and she progressed rapidly to C/C +2  [name], CNM 08/08/2014 7:38 PM

## 2014-08-08 NOTE — MAU Provider Note (Signed)
Attestation of Attending Supervision of Advanced Practitioner (PA/CNM/NP): Evaluation and management procedures were performed by the Advanced Practitioner under my supervision and collaboration.  I have reviewed the Advanced Practitioner's note and chart, and I agree with the management and plan.  Kalif Kattner, MD, FACOG Attending Obstetrician & Gynecologist Faculty Practice, [facility]   

## 2014-08-09 LAB — HIV ANTIBODY (ROUTINE TESTING W REFLEX): HIV 1&2 Ab, 4th Generation: NONREACTIVE

## 2014-08-09 LAB — RPR

## 2014-08-09 MED ORDER — ONDANSETRON HCL 4 MG PO TABS: 4.0000 mg | ORAL_TABLET | ORAL | Status: DC | PRN

## 2014-08-09 MED ORDER — BENZOCAINE-MENTHOL 20-0.5 % EX AERO
1.0000 "application " | INHALATION_SPRAY | CUTANEOUS | Status: DC | PRN
  Administered 2014-08-09: 1 via TOPICAL
  Filled 2014-08-09: qty 56

## 2014-08-09 MED ORDER — WITCH HAZEL-GLYCERIN EX PADS: 1.0000 "application " | MEDICATED_PAD | CUTANEOUS | Status: DC | PRN

## 2014-08-09 MED ORDER — DIBUCAINE 1 % RE OINT: 1.0000 "application " | TOPICAL_OINTMENT | RECTAL | Status: DC | PRN

## 2014-08-09 MED ORDER — SENNOSIDES-DOCUSATE SODIUM 8.6-50 MG PO TABS
2.0000 | ORAL_TABLET | ORAL | Status: DC
  Administered 2014-08-09: 2 via ORAL
  Filled 2014-08-09: qty 2

## 2014-08-09 MED ORDER — SIMETHICONE 80 MG PO CHEW: 80.0000 mg | CHEWABLE_TABLET | ORAL | Status: DC | PRN

## 2014-08-09 MED ORDER — PRENATAL MULTIVITAMIN CH
1.0000 | ORAL_TABLET | Freq: Every day | ORAL | Status: DC
  Administered 2014-08-09 – 2014-08-10 (×2): 1 via ORAL
  Filled 2014-08-09 (×2): qty 1

## 2014-08-09 MED ORDER — ONDANSETRON HCL 4 MG/2ML IJ SOLN: 4.0000 mg | INTRAMUSCULAR | Status: DC | PRN

## 2014-08-09 MED ORDER — IBUPROFEN 600 MG PO TABS
600.0000 mg | ORAL_TABLET | Freq: Four times a day (QID) | ORAL | Status: DC
  Administered 2014-08-09 – 2014-08-10 (×6): 600 mg via ORAL
  Filled 2014-08-09 (×8): qty 1

## 2014-08-09 MED ORDER — TETANUS-DIPHTH-ACELL PERTUSSIS 5-2.5-18.5 LF-MCG/0.5 IM SUSP: 0.5000 mL | Freq: Once | INTRAMUSCULAR | Status: DC

## 2014-08-09 MED ORDER — OXYCODONE-ACETAMINOPHEN 5-325 MG PO TABS
1.0000 | ORAL_TABLET | ORAL | Status: DC | PRN
Start: 2014-08-09 — End: 2014-08-10
  Administered 2014-08-09: 1 via ORAL
  Filled 2014-08-09: qty 1

## 2014-08-09 MED ORDER — ZOLPIDEM TARTRATE 5 MG PO TABS: 5.0000 mg | ORAL_TABLET | Freq: Every evening | ORAL | Status: DC | PRN

## 2014-08-09 MED ORDER — DIPHENHYDRAMINE HCL 25 MG PO CAPS: 25.0000 mg | ORAL_CAPSULE | Freq: Four times a day (QID) | ORAL | Status: DC | PRN

## 2014-08-09 MED ORDER — LANOLIN HYDROUS EX OINT: TOPICAL_OINTMENT | CUTANEOUS | Status: DC | PRN

## 2014-08-09 MED ORDER — OXYCODONE-ACETAMINOPHEN 5-325 MG PO TABS
2.0000 | ORAL_TABLET | ORAL | Status: DC | PRN
  Administered 2014-08-09: 2 via ORAL
  Filled 2014-08-09: qty 2

## 2014-08-09 NOTE — Progress Notes (Signed)
Post Partum Day 1 Subjective: no complaints, up ad lib, voiding and tolerating PO  Objective: Blood pressure 115/58, pulse 91, temperature 99.1 F (37.3 C), temperature source Oral, resp. rate 20, height  (1.651 m), weight 193 lb 3.2 oz (87.635 kg), last menstrual period 10/27/2013, SpO2 100.00%, unknown if currently breastfeeding.  Physical Exam:  General: alert, cooperative and no distress Lochia: appropriate Uterine Fundus: firm Incision: healing well DVT Evaluation: No evidence of DVT seen on physical exam.   Recent Labs  08/08/14 1700  HGB 10.3*  HCT 32.9*    Assessment/Plan: Plan for discharge tomorrow, Breastfeeding and Lactation consult   LOS: 1 day   [facility] 08/09/2014, 7:04 AM

## 2014-08-09 NOTE — Lactation Note (Signed)
This note was copied from the chart of Monique Patrick. Lactation Consultation Note  Patient Name: Monique Diamonds Lippard [id number] Date: 08/09/2014 Reason for consult: Initial assessment Baby 23 hours of life. Mom nursing baby when [facility] entered room, but quickly stops, stating "baby is now asleep." Believe mom stopped due to room full of people. Patient's MBU RN Lyla Son in room, had assisted with the latch. Mom chose to use family interpreter. Mom states that she nursed first child 1.5 years. Enc mom to feed with cues, offer lots of STS, and alternate which breast she uses to begin each BF session. Mom states that she knows how to hand expression breasts, and she has seen colostrum. Discussed cluster-feeding with mom. Mom asked about alternating positions with baby, enc her to do so. Mom given [facility] brochure, aware of OP/BFSG and community resources. Enc mom to call for assistance with latching as needed.   Maternal Data Has patient been taught Hand Expression?: Yes Does the patient have breastfeeding experience prior to this delivery?: Yes  Feeding Feeding Type:  (Mom breastfeeding when LC entered room.) Length of feed: 10 min  LATCH Score/Interventions Latch: Grasps breast easily, tongue down, lips flanged, rhythmical sucking. Intervention(s): Adjust position;Assist with latch;Breast massage  Audible Swallowing: Spontaneous and intermittent Intervention(s): Alternate breast massage  Type of Nipple: Everted at rest and after stimulation  Comfort (Breast/Nipple): Soft / non-tender     Hold (Positioning): Assistance needed to correctly position infant at breast and maintain latch. Intervention(s): Support Pillows;Breastfeeding basics reviewed  LATCH Score: 9  Lactation Tools Discussed/Used     Consult Status Consult Status: PRN    Geralynn Ochs 08/09/2014, 6:07 PM

## 2014-08-10 ENCOUNTER — Encounter (HOSPITAL_COMMUNITY): Payer: Self-pay | Admitting: *Deleted

## 2014-08-10 MED ORDER — IBUPROFEN 600 MG PO TABS: 600.0000 mg | ORAL_TABLET | Freq: Four times a day (QID) | ORAL | Status: DC

## 2014-08-10 NOTE — Discharge Summary (Signed)
Obstetric Discharge Summary Reason for Admission: onset of labor Prenatal Procedures: ultrasound Intrapartum Procedures: spontaneous vaginal delivery Postpartum Procedures: none Complications-Operative and Postpartum: none Hemoglobin  Date Value Ref Range Status  08/08/2014 10.3* 12.0 - 15.0 g/dL Final  1/61/0960 45.4   Final     HCT  Date Value Ref Range Status  08/08/2014 32.9* 36.0 - 46.0 % Final  12/12/2013 36   Final    Physical Exam:  General: alert, cooperative, appears stated age and no distress Lochia: appropriate Uterine Fundus: firm Incision: n/a DVT Evaluation: No evidence of DVT seen on physical exam. Negative Homan's sign. No cords or calf tenderness. No significant calf/ankle edema.  Discharge Diagnoses: Term Pregnancy-delivered  Discharge Information: Date: 08/10/2014 Activity: pelvic rest Diet: routine Medications: PNV, Ibuprofen and Percocet Condition: stable and improved Instructions: refer to practice specific booklet Discharge to: home Follow-up Information   Follow up with [facility] In 5 weeks.   Contact information:   [address] [place] [address] [phone]      Newborn Data: Live born female  Birth Weight: 8 lb 10 oz (3912 g) APGAR: 8, 4  Home with mother.  Wyvonnia Dusky DARLENE 08/10/2014, 7:46 AM

## 2014-08-10 NOTE — Progress Notes (Signed)
Ur chart review completed.  

## 2014-08-24 ENCOUNTER — Encounter: Payer: Self-pay | Admitting: Obstetrics & Gynecology

## 2014-09-28 ENCOUNTER — Encounter (HOSPITAL_COMMUNITY): Payer: Self-pay | Admitting: *Deleted

## 2014-09-30 ENCOUNTER — Ambulatory Visit (INDEPENDENT_AMBULATORY_CARE_PROVIDER_SITE_OTHER): Payer: Medicaid Other | Admitting: Obstetrics & Gynecology

## 2014-09-30 ENCOUNTER — Ambulatory Visit: Payer: Medicaid Other | Admitting: Family Medicine

## 2014-09-30 ENCOUNTER — Encounter: Payer: Self-pay | Admitting: Obstetrics & Gynecology

## 2014-09-30 VITALS — BP 95/56 | HR 67 | Temp 98.0°F | Resp 20 | Wt 164.9 lb

## 2014-09-30 DIAGNOSIS — Z30430 Encounter for insertion of intrauterine contraceptive device: Secondary | ICD-10-CM

## 2014-09-30 DIAGNOSIS — Z3202 Encounter for pregnancy test, result negative: Secondary | ICD-10-CM

## 2014-09-30 DIAGNOSIS — Z308 Encounter for other contraceptive management: Secondary | ICD-10-CM

## 2014-09-30 LAB — POCT PREGNANCY, URINE: PREG TEST UR: NEGATIVE

## 2014-09-30 MED ORDER — ETONOGESTREL 68 MG ~~LOC~~ IMPL
68.0000 mg | DRUG_IMPLANT | Freq: Once | SUBCUTANEOUS | Status: AC
  Administered 2014-09-30: 68 mg via SUBCUTANEOUS

## 2014-09-30 NOTE — Progress Notes (Signed)
Pt desires Implanon insertion today.

## 2014-09-30 NOTE — Progress Notes (Addendum)
Patient ID: Monique Patrick, female   DOB: Aug 12, 1986, 28 y.o.   MRN: [id number] Subjective:     Monique Patrick is a 28 y.o. female who presents for a postpartum visit. She is 7 weeks postpartum following a spontaneous vaginal delivery. I have fully reviewed the prenatal and intrapartum course. The delivery was at 40 gestational weeks. Outcome: spontaneous vaginal delivery. Anesthesia: none. Postpartum course has been benign. Baby's course has been uncomplicated. Baby is feeding by breast and bottle. Bleeding no bleeding. Bowel function is normal. Bladder function is normal. Patient is not sexually active. Contraception method is abstinence. Postpartum depression screening: negative.  The following portions of the patient's history were reviewed and updated as appropriate: allergies, current medications, past family history, past medical history, past social history, past surgical history and problem list.  Review of Systems A comprehensive review of systems was negative.   Objective:    BP 95/56 mmHg  Pulse 67  Temp(Src) 98 F (36.7 C) (Oral)  Resp 20  Wt 164 lb 14.4 oz (74.798 kg)  Breastfeeding? Yes   Patient given informed consent, she signed consent form. Pregnancy test was negative.  Appropriate time out taken.  Patient's left arm was prepped and draped in the usual sterile fashion.. The ruler used to measure and mark insertion area.  Patient was prepped with alcohol swab and then injected with 5 ml of 1 % lidocaine.  She was prepped with betadine, Nexplanon removed from packaging,  Device confirmed in needle, then inserted full length of needle and withdrawn per handbook instructions.  There was minimal blood loss.  Patient insertion site covered with guaze and a pressure bandage to reduce any bruising.  The patient tolerated the procedure well and was given post procedure instructions. Return in about one month for Nexplanon check.         Assessment:     7 weeks postpartum exam. Pap  smear not done at today's visit.   Contraception management- Nexplanon placed today  Plan:    1. Contraception: Nexplanon 2.  Follow up in: 4 weeks if problems or as needed.

## 2014-09-30 NOTE — Addendum Note (Signed)
Addended by: Candelaria StagersHAIZLIP, Yacqub Baston E on: 09/30/2014 03:54 PM   Modules accepted: Orders

## 2014-09-30 NOTE — Patient Instructions (Signed)
Contraceptive Implant Information A contraceptive implant is a plastic rod that is inserted under your skin. It is usually inserted under the skin of your upper arm. It continually releases small amounts of progestin (synthetic progesterone) into your bloodstream. This prevents an egg from being released from your ovaries. It also thickens your cervical mucus to prevent sperm from entering the cervix, and it thins your uterine lining to prevent a fertilized egg from attaching to your uterus. Contraceptive implants can be effective for up to 3 years. They do not provide protection against sexually transmitted diseases (STDs).  The procedure to insert an implant usually takes about 10 minutes. There may be minor bruising, swelling, and discomfort at the insertion site for a couple days. The implant begins to work within the first day. Other contraceptive protection may be necessary for 7 days. Be sure to discuss with your health care provider if you need a backup method of contraception.  Your health care provider will make sure you are a good candidate for the contraceptive implant. Discuss with your health care provider the possible side effects of the implant. ADVANTAGES  It prevents pregnancy for up to 3 years.  It is easily reversible.  It is convenient.  It can be used when breastfeeding.  It can be used by women who cannot take estrogen. DISADVANTAGES  You may have irregular or unplanned vaginal bleeding.  You may develop side effects, including headache, weight gain, acne, breast tenderness, or mood changes.  You may have tissue or nerve damage after insertion (rare).  It may be difficult and uncomfortable to remove.  Certain medicines may interfere with the effectiveness of the implant. REMOVAL OF IMPLANT The implant should be removed in 3 years or as directed by your health care provider. The implant's effect wears off in a few hours after removal. Your ability to get pregnant  (fertility) may be restored in 1-2 weeks. A new implant can be inserted as soon as the old one is removed if desired. CONTRAINDICATIONS You should not get the implant if you are experiencing any of the following situations:  You are pregnant.  You have a history of breast cancer, osteoporosis, blood clots, heart disease, diabetes, high blood pressure, liver disease, tumors, or stroke.   You have undiagnosed vaginal bleeding.  You have a sensitivity to any part of the implant. Document Released: 11/02/2011 Document Revised: 07/16/2013 Document Reviewed: 05/12/2013 ExitCare Patient Information 2015 [employer]. This information is not intended to replace advice given to you by your health care provider. Make sure you discuss any questions you have with your health care provider.  

## 2014-11-12 ENCOUNTER — Encounter: Payer: Self-pay | Admitting: *Deleted

## 2015-03-24 ENCOUNTER — Emergency Department (HOSPITAL_COMMUNITY): Payer: 59

## 2015-03-24 ENCOUNTER — Encounter (HOSPITAL_COMMUNITY): Payer: Self-pay | Admitting: Emergency Medicine

## 2015-03-24 ENCOUNTER — Inpatient Hospital Stay (HOSPITAL_COMMUNITY)
Admission: EM | Admit: 2015-03-24 | Discharge: 2015-03-24 | DRG: 316 | Disposition: A | Discharge status: Home / Self Care | Payer: 59 | Attending: Family Medicine | Admitting: Family Medicine

## 2015-03-24 DIAGNOSIS — R079 Chest pain, unspecified: Secondary | ICD-10-CM | POA: Diagnosis present

## 2015-03-24 DIAGNOSIS — R064 Hyperventilation: Secondary | ICD-10-CM | POA: Diagnosis present

## 2015-03-24 DIAGNOSIS — I959 Hypotension, unspecified: Principal | ICD-10-CM | POA: Diagnosis present

## 2015-03-24 DIAGNOSIS — F419 Anxiety disorder, unspecified: Secondary | ICD-10-CM | POA: Diagnosis present

## 2015-03-24 DIAGNOSIS — R05 Cough: Secondary | ICD-10-CM | POA: Diagnosis not present

## 2015-03-24 HISTORY — DX: Hypotension, unspecified: I95.9

## 2015-03-24 LAB — CBC
HEMATOCRIT: 41.3 % (ref 36.0–46.0)
HEMOGLOBIN: 13.2 g/dL (ref 12.0–15.0)
MCH: 27.7 pg (ref 26.0–34.0)
MCHC: 32 g/dL (ref 30.0–36.0)
MCV: 86.6 fL (ref 78.0–100.0)
PLATELETS: 378 10*3/uL (ref 150–400)
RBC: 4.77 MIL/uL (ref 3.87–5.11)
RDW: 13.9 % (ref 11.5–15.5)
WBC: 9.4 10*3/uL (ref 4.0–10.5)

## 2015-03-24 LAB — D-DIMER, QUANTITATIVE: D-Dimer, Quant: <0.27 ug/mL-FEU (ref 0.00–0.48)

## 2015-03-24 LAB — BASIC METABOLIC PANEL
Anion gap: 9 (ref 5–15)
BUN: 17 mg/dL (ref 6–23)
CALCIUM: 9.5 mg/dL (ref 8.4–10.5)
CO2: 22 mmol/L (ref 19–32)
Chloride: 109 mmol/L (ref 96–112)
Creatinine, Ser: 0.95 mg/dL (ref 0.50–1.10)
GFR calc Af Amer: >90 mL/min (ref >90)
GFR calc non Af Amer: 81 mL/min — ABNORMAL LOW (ref >90)
GLUCOSE: 105 mg/dL — AB (ref 70–99)
POTASSIUM: 3.5 mmol/L (ref 3.5–5.1)
Sodium: 140 mmol/L (ref 135–145)

## 2015-03-24 LAB — I-STAT TROPONIN, ED: Troponin i, poc: 0 ng/mL (ref 0.00–0.08)

## 2015-03-24 LAB — BRAIN NATRIURETIC PEPTIDE: B Natriuretic Peptide: 15.4 pg/mL (ref 0.0–100.0)

## 2015-03-24 MED ORDER — IBUPROFEN 600 MG PO TABS: 600.0000 mg | ORAL_TABLET | Freq: Four times a day (QID) | ORAL | Status: DC | PRN

## 2015-03-24 MED ORDER — SODIUM CHLORIDE 0.9 % IV SOLN: INTRAVENOUS | Status: DC

## 2015-03-24 MED ORDER — IBUPROFEN 200 MG PO TABS
400.0000 mg | ORAL_TABLET | Freq: Once | ORAL | Status: AC
  Administered 2015-03-24: 400 mg via ORAL
  Filled 2015-03-24: qty 2

## 2015-03-24 NOTE — Discharge Instructions (Signed)
Costochondritis Costochondritis, sometimes called Tietze syndrome, is a swelling and irritation (inflammation) of the tissue (cartilage) that connects your ribs with your breastbone (sternum). It causes pain in the chest and rib area. Costochondritis usually goes away on its own over time. It can take up to 6 weeks or longer to get better, especially if you are unable to limit your activities. CAUSES  Some cases of costochondritis have no known cause. Possible causes include:  Injury (trauma).  Exercise or activity such as lifting.  Severe coughing. SIGNS AND SYMPTOMS  Pain and tenderness in the chest and rib area.  Pain that gets worse when coughing or taking deep breaths.  Pain that gets worse with specific movements. DIAGNOSIS  Your health care provider will do a physical exam and ask about your symptoms. Chest X-rays or other tests may be done to rule out other problems. TREATMENT  Costochondritis usually goes away on its own over time. Your health care provider may prescribe medicine to help relieve pain. HOME CARE INSTRUCTIONS   Avoid exhausting physical activity. Try not to strain your ribs during normal activity. This would include any activities using chest, abdominal, and side muscles, especially if heavy weights are used.  Apply ice to the affected area for the first 2 days after the pain begins.  Put ice in a plastic bag.  Place a towel between your skin and the bag.  Leave the ice on for 20 minutes, 2-3 times a day.  Only take over-the-counter or prescription medicines as directed by your health care provider. SEEK MEDICAL CARE IF:  You have redness or swelling at the rib joints. These are signs of infection.  Your pain does not go away despite rest or medicine. SEEK IMMEDIATE MEDICAL CARE IF:   Your pain increases or you are very uncomfortable.  You have shortness of breath or difficulty breathing.  You cough up blood.  You have worse chest pains,  sweating, or vomiting.  You have a fever or persistent symptoms for more than 2-3 days.  You have a fever and your symptoms suddenly get worse. MAKE SURE YOU:   Understand these instructions.  Will watch your condition.  Will get help right away if you are not doing well or get worse. Document Released: 08/23/2005 Document Revised: 09/03/2013 Document Reviewed: 06/17/2013 [employer] Patient Information 2015 [employer]. This information is not intended to replace advice given to you by your health care provider. Make sure you discuss any questions you have with your health care provider.  

## 2015-03-24 NOTE — ED Notes (Signed)
Pt reports chest pain for the past 6 days with cough. Per EMS, pain reproducible with palpation. Pt was hyperventilating upon EMS arrival and complained of generalized numbness and tingling. Pt reports she is feeling a little bit better after calming her breathing.

## 2015-03-24 NOTE — ED Provider Notes (Signed)
CSN: [id number]     Arrival date & time 03/24/15  1745 History   First MD Initiated Contact with Patient 03/24/15 2027     Chief Complaint  Patient presents with  . Anxiety  . Chest Pain      HPI Pt reports chest pain for the past 6 days with cough. Per EMS, pain reproducible with palpation. Pt was hyperventilating upon EMS arrival and complained of generalized numbness and tingling. Pt reports she is feeling a little bit better after calming her breathing.  Past Medical History  Diagnosis Date  . Allergy    History reviewed. No pertinent past surgical history. Family History  Problem Relation Age of Onset  . Diabetes Father   . Diabetes Maternal Grandmother   . Diabetes Paternal Grandfather    History  Substance Use Topics  . Smoking status: Never Smoker   . Smokeless tobacco: Never Used  . Alcohol Use: No   OB History    Gravida Para Term Preterm AB TAB SAB Ectopic Multiple Living   Review of Systems  All other systems reviewed and are negative.     Allergies  Review of patient's allergies indicates no known allergies.  Home Medications   Prior to Admission medications   Medication Sig Start Date End Date Taking? Authorizing Provider  ibuprofen (ADVIL,MOTRIN) 600 MG tablet Take 1 tablet (600 mg total) by mouth every 6 (six) hours as needed. 03/24/15   Nelva Nay, MD  Prenatal Vit-Fe Fumarate-FA (PRENATAL VITAMINS PLUS) 27-1 MG TABS Take 1 tablet by mouth daily. Patient not taking: Reported on 03/24/2015 01/21/14   Misty Stanley A Leftwich-Kirby, CNM   BP 95/58 mmHg  Pulse 108  Temp(Src) 97.6 F (36.4 C) (Oral)  Resp 16  SpO2 100%  LMP  Physical Exam  Constitutional: She is oriented to person, place, and time. She appears well-developed and well-nourished. No distress.  HENT:  Head: Normocephalic and atraumatic.  Eyes: Pupils are equal, round, and reactive to light.  Neck: Normal range of motion.  Cardiovascular: Normal rate and intact distal  pulses.   Pulmonary/Chest: No respiratory distress.    Pain reproducible to palpation  Abdominal: Normal appearance. She exhibits no distension.  Musculoskeletal: Normal range of motion.  Neurological: She is alert and oriented to person, place, and time. No cranial nerve deficit.  Skin: Skin is warm and dry. No rash noted.  Psychiatric: She has a normal mood and affect. Her behavior is normal.  Nursing note and vitals reviewed.   ED Course  Procedures (including critical care time) Labs Review Labs Reviewed  BASIC METABOLIC PANEL - Abnormal; Notable for the following:    Glucose, Bld 105 (*)    GFR calc non Af Amer 81 (*)    All other components within normal limits  CBC  BRAIN NATRIURETIC PEPTIDE  D-DIMER, QUANTITATIVE  I-STAT TROPOININ, ED    Imaging Review Dg Chest 2 View (if Patient Has Fever And/or Copd)  03/24/2015   CLINICAL DATA:  Pt reports chest pain for the past 6 days with cough. Per EMS, pain reproducible with palpation. Pt was hyperventilating upon EMS arrival and complained of generalized numbness and tingling. Pt reports she is feeling a little bit better after calming her breathing. Hx of possible left side broken ribs.  EXAM: CHEST  2 VIEW  COMPARISON:  None.  FINDINGS: The heart size and mediastinal contours are within normal limits. Both lungs are  clear. No pleural effusion or pneumothorax. The visualized skeletal structures are unremarkable.  IMPRESSION: No active cardiopulmonary disease.   Electronically Signed   By: Amie Portlandavid  Ormond M.D.   On: 03/24/2015 19:18     EKG Interpretation   Date/Time:  Wednesday March 24 2015 17:59:59 EDT Ventricular Rate:  102 PR Interval:  119 QRS Duration: 93 QT Interval:  399 QTC Calculation: 520 R Axis:   56 Text Interpretation:  Sinus tachycardia RSR' in V1 or V2, probably normal  variant Borderline T wave abnormalities Prolonged QT interval Baseline  wander in lead(s) V6 No old tracing to compare Confirmed by WARD,   DO,  KRISTEN [phone]([id number]) on 03/24/2015 6:23:51 PM      MDM   Final diagnoses:  Hypotension, unspecified hypotension type        Nelva Nayobert Vylet Maffia, MD 03/24/15 [phone]

## 2015-07-23 ENCOUNTER — Ambulatory Visit (INDEPENDENT_AMBULATORY_CARE_PROVIDER_SITE_OTHER): Payer: 59 | Admitting: Family Medicine

## 2015-07-23 VITALS — BP 110/60 | HR 79 | Temp 98.5°F | Resp 18 | Ht 67.0 in | Wt 155.0 lb

## 2015-07-23 DIAGNOSIS — R002 Palpitations: Secondary | ICD-10-CM | POA: Diagnosis not present

## 2015-07-23 DIAGNOSIS — R42 Dizziness and giddiness: Secondary | ICD-10-CM

## 2015-07-23 DIAGNOSIS — Z8349 Family history of other endocrine, nutritional and metabolic diseases: Secondary | ICD-10-CM | POA: Diagnosis not present

## 2015-07-23 DIAGNOSIS — G4700 Insomnia, unspecified: Secondary | ICD-10-CM | POA: Diagnosis not present

## 2015-07-23 LAB — POCT URINALYSIS DIPSTICK
Bilirubin, UA: NEGATIVE
Blood, UA: NEGATIVE
Glucose, UA: NEGATIVE
Ketones, UA: NEGATIVE
Leukocytes, UA: NEGATIVE
Nitrite, UA: NEGATIVE
Protein, UA: NEGATIVE
Spec Grav, UA: 1.02
Urobilinogen, UA: 0.2
pH, UA: 7

## 2015-07-23 LAB — POCT CBC
Granulocyte percent: 52.7 %G (ref 37–80)
HCT, POC: 38.8 % (ref 37.7–47.9)
Hemoglobin: 12.1 g/dL — AB (ref 12.2–16.2)
Lymph, poc: 2.4 (ref 0.6–3.4)
MCH, POC: 26 pg — AB (ref 27–31.2)
MCHC: 31.2 g/dL — AB (ref 31.8–35.4)
MCV: 83.3 fL (ref 80–97)
MID (cbc): 0.3 (ref 0–0.9)
MPV: 7.6 fL (ref 0–99.8)
POC Granulocyte: 3.1 (ref 2–6.9)
POC LYMPH PERCENT: 41.9 % (ref 10–50)
POC MID %: 5.4 % (ref 0–12)
Platelet Count, POC: 380 10*3/uL (ref 142–424)
RBC: 4.66 M/uL (ref 4.04–5.48)
RDW, POC: 12.7 %
WBC: 5.8 10*3/uL (ref 4.6–10.2)

## 2015-07-23 LAB — GLUCOSE, POCT (MANUAL RESULT ENTRY): POC Glucose: 95 mg/dl (ref 70–99)

## 2015-07-23 LAB — POCT UA - MICROSCOPIC ONLY
Bacteria, U Microscopic: NEGATIVE
Casts, Ur, LPF, POC: NEGATIVE
Crystals, Ur, HPF, POC: NEGATIVE
Mucus, UA: NEGATIVE
RBC, urine, microscopic: NEGATIVE
Yeast, UA: NEGATIVE

## 2015-07-23 NOTE — Progress Notes (Signed)
Chief Complaint:  Chief Complaint  Patient presents with  . Headache    since may come and goes  . Dizziness  . Palpitations    HPI: Monique Patrick is a 29 y.o. female who reports to [facility] today complaining of headache since May and also chest pain f/u from [facility] ER in April 2016 Workup then in ER was negative. She is having chest palpitations now and has a diffuse inermittent HA and is not sleepign very well. No other neuro sxs.  Her aunts have thyroid disease She has a 98 year old, She is breast feeding Her husbnad works second shift and they stay up and her sleep is off, they stay up until 2 am Now she sleeps just 2 hour increments  because she does not want to sleep G2 L2, her current baby is  12 month, she is 1/2 breastfeed and eating 1/2 solids HaS has not had any problems with urination, menstrual cycle, is on birth control without problems She gets dizzy when she is sitting or standing, she denies any SOB, she has some palpitations. She is dizzy for about 5 min and happens about every hour. No triggers.  The palpitations is what bothers her the most, she gets it when she stands up and at night time.    BP Readings from Last 3 Encounters:  07/23/15 110/60  03/24/15 99/35  09/30/14 95/56     Past Medical History  Diagnosis Date  . Allergy    History reviewed. No pertinent past surgical history. Social History   Social History  . Marital Status: Married    Spouse Name: N/A  . Number of Children: N/A  . Years of Education: N/A   Social History Main Topics  . Smoking status: Never Smoker   . Smokeless tobacco: Never Used  . Alcohol Use: No  . Drug Use: No  . Sexual Activity: No     Comment: desires Implanon   Other Topics Concern  . None   Social History Narrative   Family History  Problem Relation Age of Onset  . Diabetes Father   . Diabetes Maternal Grandmother   . Diabetes Paternal Grandfather    No Known Allergies Prior to Admission medications    Medication Sig Start Date End Date Taking? Authorizing Provider  ibuprofen (ADVIL,MOTRIN) 600 MG tablet Take 1 tablet (600 mg total) by mouth every 6 (six) hours as needed. 03/24/15  Yes Nelva Nay, MD  norelgestromin-ethinyl estradiol (ORTHO EVRA) 150-35 MCG/24HR transdermal patch Place 1 patch onto the skin once a week.   Yes Historical Provider, MD  Prenatal Vit-Fe Fumarate-FA (PRENATAL VITAMINS PLUS) 27-1 MG TABS Take 1 tablet by mouth daily. Patient not taking: Reported on 03/24/2015 01/21/14   Wilmer Floor Leftwich-Kirby, CNM     ROS: The patient denies fevers, chills, night sweats, unintentional weight loss, chest pain,  wheezing, dyspnea on exertion, nausea, vomiting, abdominal pain, dysuria, hematuria, melena, numbness, weakness, or tingling.   All other systems have been reviewed and were otherwise negative with the exception of those mentioned in the HPI and as above.    PHYSICAL EXAM: Filed Vitals:   07/23/15 1406  BP: 110/60  Pulse: 79  Temp: 98.5 F (36.9 C)  Resp: 18   Body mass index is 24.27 kg/(m^2).   General: Alert, no acute distress HEENT:  Normocephalic, atraumatic, oropharynx patent. EOMI, PERRLA, fundo exma normal Cardiovascular:  Regular rate and rhythm, no rubs murmurs or gallops.  No Carotid bruits,  radial pulse intact. No pedal edema.  Respiratory: Clear to auscultation bilaterally.  No wheezes, rales, or rhonchi.  No cyanosis, no use of accessory musculature Abdominal: No organomegaly, abdomen is soft and non-tender, positive bowel sounds. No masses. Skin: No rashes. Neurologic: Facial musculature symmetric. CN 2-12 normal Psychiatric: Patient acts appropriately throughout our interaction. Lymphatic: No cervical or submandibular lymphadenopathy Musculoskeletal: Gait intact. No edema, tenderness 5/5 strength , 2/2 DTRs, sensation intact   LABS: Results for orders placed or performed in visit on 07/23/15  POCT CBC  Result Value Ref Range   WBC 5.8 4.6  - 10.2 K/uL   Lymph, poc 2.4 0.6 - 3.4   POC LYMPH PERCENT 41.9 10 - 50 %L   MID (cbc) 0.3 0 - 0.9   POC MID % 5.4 0 - 12 %M   POC Granulocyte 3.1 2 - 6.9   Granulocyte percent 52.7 37 - 80 %G   RBC 4.66 4.04 - 5.48 M/uL   Hemoglobin 12.1 (A) 12.2 - 16.2 g/dL   HCT, POC 16.1 09.6 - 47.9 %   MCV 83.3 80 - 97 fL   MCH, POC 26.0 (A) 27 - 31.2 pg   MCHC 31.2 (A) 31.8 - 35.4 g/dL   RDW, POC 04.5 %   Platelet Count, POC 380 142 - 424 K/uL   MPV 7.6 0 - 99.8 fL  POCT UA - Microscopic Only  Result Value Ref Range   WBC, Ur, HPF, POC 0-1    RBC, urine, microscopic neg    Bacteria, U Microscopic neg    Mucus, UA neg    Epithelial cells, urine per micros 1-3    Crystals, Ur, HPF, POC neg    Casts, Ur, LPF, POC neg    Yeast, UA neg   POCT glucose (manual entry)  Result Value Ref Range   POC Glucose 95 70 - 99 mg/dl  POCT urinalysis dipstick  Result Value Ref Range   Color, UA yellow    Clarity, UA clear    Glucose, UA neg    Bilirubin, UA neg    Ketones, UA neg    Spec Grav, UA 1.020    Blood, UA neg    pH, UA 7.0    Protein, UA neg    Urobilinogen, UA 0.2    Nitrite, UA neg    Leukocytes, UA Negative Negative     EKG/XRAY:   Primary read interpreted by Dr. Conley Rolls at [facility].   ASSESSMENT/PLAN: Encounter Diagnoses  Name Primary?  . Palpitations Yes  . Dizziness and giddiness   . Family history of thyroid disease   . Insomnia    Pleasant 25 G2L2 Arabic speaking female who is here with her cousin who presents with vague sxs of chest palpitations, fatigue, dizziness. She has a 29 month old and a toddler and a lot of sxs may be attributed to poor nutrition, fluid intake and sleep hygeien. HOwever she is here beause she has been having chest palpiations and is worried about  that, mostly at night. This may be an anxiety or depression component but she denies either. She went to the ER for cardiac and DVT/PE workup in April withsimilar complaints except had SOB and no palpitations at  that time. The workup in ER was negative.  Increase fluids, eat regular meals Labs pending Cardiology referral for palpitations Fu prn   Gross sideeffects, risk and benefits, and alternatives of medications d/w patient. Patient is aware that all medications have potential sideeffects and we  are unable to predict every sideeffect or drug-drug interaction that may occur.  Calixto Pavel DO  07/23/2015 4:07 PM

## 2015-07-23 NOTE — Patient Instructions (Signed)
Melatonin oral capsules and tablets What is this medicine? MELATONIN (mel uh TOH nin) is a dietary supplement. It is promoted to help maintain normal sleep patterns. The FDA has not approved this supplement for any medical use. This supplement may be used for other purposes; ask your health care provider or pharmacist if you have questions. This medicine may be used for other purposes; ask your health care provider or pharmacist if you have questions. COMMON BRAND NAME(S): Melatonex What should I tell my health care provider before I take this medicine? They need to know if you have any of these conditions: -cancer -if you frequently drink alcohol containing drinks -immune system problems -liver disease -seizure disorder -an unusual or allergic reaction to melatonin, other medicines, foods, dyes, or preservatives -pregnant or trying to get pregnant -breast-feeding How should I use this medicine? Take this supplement by mouth with a glass of water. This supplement is usually taken prior to bedtime. Do not chew or crush most tablets or capsules. Some tablets are chewable and are chewed before swallowing. Some tablets are meant to be dissolved in the mouth or under the tongue. Follow the directions on the package labeling, or take as directed by your health care professional. Do not take this supplement more often than directed. Talk to your pediatrician regarding the use of this supplement in children. This supplement is not recommended for use in children. Overdosage: If you think you have taken too much of this medicine contact a poison control center or emergency room at once. NOTE: This medicine is only for you. Do not share this medicine with others. What if I miss a dose? This does not apply; this medicine is not for regular use. Do not take double or extra doses. What may interact with this medicine? Check with your doctor or healthcare professional if you are taking any of the following  medications: -hormone medicines -medicines for blood pressure like nifedipine -medications for anxiety, depression, or other emotional or psychiatric problems -medications for seizures -medications for sleep -other herbal or dietary supplements -tamoxifen -treatments for cancer or immune disorders This list may not describe all possible interactions. Give your health care provider a list of all the medicines, herbs, non-prescription drugs, or dietary supplements you use. Also tell them if you smoke, drink alcohol, or use illegal drugs. Some items may interact with your medicine. What should I watch for while using this medicine? See your doctor if your symptoms do not get better or if they get worse. Do not take this supplement for more than 2 weeks unless your doctor tells you to. You may get drowsy or dizzy. Do not drive, use machinery, or do anything that needs mental alertness until you know how this medicine affects you. Do not stand or sit up quickly, especially if you are an older patient. This reduces the risk of dizzy or fainting spells. Alcohol may interfere with the effect of this medicine. Avoid alcoholic drinks. Talk to your doctor before you use this supplement if you are currently being treated for an emotional, mental, or sleep problem. This medicine may interfere with your treatment. Herbal or dietary supplements are not regulated like medicines. Rigid quality control standards are not required for dietary supplements. The purity and strength of these products can vary. The safety and effect of this dietary supplement for a certain disease or illness is not well known. This product is not intended to diagnose, treat, cure or prevent any disease. The Food and Drug Administration   suggests the following to help consumers protect themselves: -Always read product labels and follow directions. -Natural does not mean a product is safe for humans to take. -Look for products that include  USP after the ingredient name. This means that the manufacturer followed the standards of the [place] Pharmacopoeia. -Supplements made or sold by a nationally known food or drug company are more likely to be made under tight controls. You can write to the company for more information about how the product was made. What side effects may I notice from receiving this medicine? Side effects that you should report to your doctor or health care professional as soon as possible: -allergic reactions like skin rash, itching or hives, swelling of the face, lips, or tongue -breathing problems -confusion, forgetful -depressed, nervous, or other mood changes -fast or pounding heartbeat -trouble staying awake or alert during the day Side effects that usually do not require medical attention (report to your doctor or health care professional if they continue or are bothersome): -drowsiness, dizziness -headache -nightmares -upset stomach This list may not describe all possible side effects. Call your doctor for medical advice about side effects. You may report side effects to FDA at 1-800-FDA-1088. Where should I keep my medicine? Keep out of the reach of children. Store at room temperature or as directed on the package label. Protect from moisture. Throw away any unused supplement after the expiration date. NOTE: This sheet is a summary. It may not cover all possible information. If you have questions about this medicine, talk to your doctor, pharmacist, or health care provider.  2015, Elsevier/Gold Standard. (2013-09-26 18:36:27) Dizziness Dizziness is a common problem. It is a feeling of unsteadiness or light-headedness. You may feel like you are about to faint. Dizziness can lead to injury if you stumble or fall. A person of any age group can suffer from dizziness, but dizziness is more common in older adults. CAUSES  Dizziness can be caused by many different things, including:  Middle ear  problems.  Standing for too long.  Infections.  An allergic reaction.  Aging.  An emotional response to something, such as the sight of blood.  Side effects of medicines.  Tiredness.  Problems with circulation or blood pressure.  Excessive use of alcohol or medicines, or illegal drug use.  Breathing too fast (hyperventilation).  An irregular heart rhythm (arrhythmia).  A low red blood cell count (anemia).  Pregnancy.  Vomiting, diarrhea, fever, or other illnesses that cause body fluid loss (dehydration).  Diseases or conditions such as Parkinson's disease, high blood pressure (hypertension), diabetes, and thyroid problems.  Exposure to extreme heat. DIAGNOSIS  Your health care provider will ask about your symptoms, perform a physical exam, and perform an electrocardiogram (ECG) to record the electrical activity of your heart. Your health care provider may also perform other heart or blood tests to determine the cause of your dizziness. These may include:  Transthoracic echocardiogram (TTE). During echocardiography, sound waves are used to evaluate how blood flows through your heart.  Transesophageal echocardiogram (TEE).  Cardiac monitoring. This allows your health care provider to monitor your heart rate and rhythm in real time.  Holter monitor. This is a portable device that records your heartbeat and can help diagnose heart arrhythmias. It allows your health care provider to track your heart activity for several days if needed.  Stress tests by exercise or by giving medicine that makes the heart beat faster. TREATMENT  Treatment of dizziness depends on the cause of your  symptoms and can vary greatly. HOME CARE INSTRUCTIONS   Drink enough fluids to keep your urine clear or pale yellow. This is especially important in very hot weather. In older adults, it is also important in cold weather.  Take your medicine exactly as directed if your dizziness is caused by  medicines. When taking blood pressure medicines, it is especially important to get up slowly.  Rise slowly from chairs and steady yourself until you feel okay.  In the morning, first sit up on the side of the bed. When you feel okay, stand slowly while holding onto something until you know your balance is fine.  Move your legs often if you need to stand in one place for a long time. Tighten and relax your muscles in your legs while standing.  Have someone stay with you for 1-2 days if dizziness continues to be a problem. Do this until you feel you are well enough to stay alone. Have the person call your health care provider if he or she notices changes in you that are concerning.  Do not drive or use heavy machinery if you feel dizzy.  Do not drink alcohol. SEEK IMMEDIATE MEDICAL CARE IF:   Your dizziness or light-headedness gets worse.  You feel nauseous or vomit.  You have problems talking, walking, or using your arms, hands, or legs.  You feel weak.  You are not thinking clearly or you have trouble forming sentences. It may take a friend or family member to notice this.  You have chest pain, abdominal pain, shortness of breath, or sweating.  Your vision changes.  You notice any bleeding.  You have side effects from medicine that seems to be getting worse rather than better. MAKE SURE YOU:   Understand these instructions.  Will watch your condition.  Will get help right away if you are not doing well or get worse. Document Released: 05/09/2001 Document Revised: 11/18/2013 Document Reviewed: 06/02/2011 [employer] [employer] Patient Information 2015 [place], [place]. This information is not intended to replace advice given to you by your health care provider. Make sure you discuss any questions you have with your health care provider. Insomnia Insomnia is frequent trouble falling and/or staying asleep. Insomnia can be a long term problem or a short term problem. Both are common. Insomnia  can be a short term problem when the wakefulness is related to a certain stress or worry. Long term insomnia is often related to ongoing stress during waking hours and/or poor sleeping habits. Overtime, sleep deprivation itself can make the problem worse. Every little thing feels more severe because you are overtired and your ability to cope is decreased. CAUSES   Stress, anxiety, and depression.  Poor sleeping habits.  Distractions such as TV in the bedroom.  Naps close to bedtime.  Engaging in emotionally charged conversations before bed.  Technical reading before sleep.  Alcohol and other sedatives. They may make the problem worse. They can hurt normal sleep patterns and normal dream activity.  Stimulants such as caffeine for several hours prior to bedtime.  Pain syndromes and shortness of breath can cause insomnia.  Exercise late at night.  Changing time zones may cause sleeping problems (jet lag). It is sometimes helpful to have someone observe your sleeping patterns. They should look for periods of not breathing during the night (sleep apnea). They should also look to see how long those periods last. If you live alone or observers are uncertain, you can also be observed at a sleep clinic where  your sleep patterns will be professionally monitored. Sleep apnea requires a checkup and treatment. Give your caregivers your medical history. Give your caregivers observations your family has made about your sleep.  SYMPTOMS   Not feeling rested in the morning.  Anxiety and restlessness at bedtime.  Difficulty falling and staying asleep. TREATMENT   Your caregiver may prescribe treatment for an underlying medical disorders. Your caregiver can give advice or help if you are using alcohol or other drugs for self-medication. Treatment of underlying problems will usually eliminate insomnia problems.  Medications can be prescribed for short time use. They are generally not recommended for  lengthy use.  Over-the-counter sleep medicines are not recommended for lengthy use. They can be habit forming.  You can promote easier sleeping by making lifestyle changes such as:  Using relaxation techniques that help with breathing and reduce muscle tension.  Exercising earlier in the day.  Changing your diet and the time of your last meal. No night time snacks.  Establish a regular time to go to bed.  Counseling can help with stressful problems and worry.  Soothing music and white noise may be helpful if there are background noises you cannot remove.  Stop tedious detailed work at least one hour before bedtime. HOME CARE INSTRUCTIONS   Keep a diary. Inform your caregiver about your progress. This includes any medication side effects. See your caregiver regularly. Take note of:  Times when you are asleep.  Times when you are awake during the night.  The quality of your sleep.  How you feel the next day. This information will help your caregiver care for you.  Get out of bed if you are still awake after 15 minutes. Read or do some quiet activity. Keep the lights down. Wait until you feel sleepy and go back to bed.  Keep regular sleeping and waking hours. Avoid naps.  Exercise regularly.  Avoid distractions at bedtime. Distractions include watching television or engaging in any intense or detailed activity like attempting to balance the household checkbook.  Develop a bedtime ritual. Keep a familiar routine of bathing, brushing your teeth, climbing into bed at the same time each night, listening to soothing music. Routines increase the success of falling to sleep faster.  Use relaxation techniques. This can be using breathing and muscle tension release routines. It can also include visualizing peaceful scenes. You can also help control troubling or intruding thoughts by keeping your mind occupied with boring or repetitive thoughts like the old concept of counting sheep. You  can make it more creative like imagining planting one beautiful flower after another in your backyard garden.  During your day, work to eliminate stress. When this is not possible use some of the previous suggestions to help reduce the anxiety that accompanies stressful situations. MAKE SURE YOU:   Understand these instructions.  Will watch your condition.  Will get help right away if you are not doing well or get worse. Document Released: 11/10/2000 Document Revised: 02/05/2012 Document Reviewed: 12/11/2007 [facility] Patient Information 2015 [place], [place]. This information is not intended to replace advice given to you by your health care provider. Make sure you discuss any questions you have with your health care provider.

## 2015-07-24 LAB — COMPLETE METABOLIC PANEL WITHOUT GFR
ALT: 39 U/L — ABNORMAL HIGH (ref 6–29)
AST: 32 U/L — ABNORMAL HIGH (ref 10–30)
Albumin: 4.1 g/dL (ref 3.6–5.1)
BUN: 9 mg/dL (ref 7–25)
Calcium: 8.9 mg/dL (ref 8.6–10.2)
Creat: 0.61 mg/dL (ref 0.50–1.10)
GFR, Est African American: >89 mL/min (ref >=60)
Total Bilirubin: 0.4 mg/dL (ref 0.2–1.2)
Total Protein: 7.5 g/dL (ref 6.1–8.1)

## 2015-07-24 LAB — COMPLETE METABOLIC PANEL WITH GFR
Alkaline Phosphatase: 60 U/L (ref 33–115)
CO2: 26 mmol/L (ref 20–31)
Chloride: 100 mmol/L (ref 98–110)
GFR, Est Non African American: >89 mL/min (ref >=60)
Glucose, Bld: 79 mg/dL (ref 65–99)
Potassium: 4.2 mmol/L (ref 3.5–5.3)
Sodium: 137 mmol/L (ref 135–146)

## 2015-07-24 LAB — TSH: TSH: 0.759 u[IU]/mL (ref 0.350–4.500)

## 2015-07-26 ENCOUNTER — Telehealth: Payer: Self-pay

## 2015-07-26 NOTE — Telephone Encounter (Signed)
Patient husband is calling to ask Dr. Conley Rolls if she could call the place where the patient was sent to and tell them that the patient needs an Urgent appointment. He doesn't know the name but he has the phone number. He states that she can't return to work until after the appointment which is scheduled on the 15th and she needs a sooner appointment so that she can go back to work. The office number is [phone] [name] is also asked about the lab results. I told him he'll get a phone call

## 2015-07-26 NOTE — Telephone Encounter (Signed)
Dr. Conley Rolls can you review the labs?

## 2015-07-26 NOTE — Telephone Encounter (Signed)
Dr. Conley Rolls you may have to speak to Dr. Jacinto Halim directly.

## 2015-07-26 NOTE — Telephone Encounter (Signed)
Pt is still having problems and still has not gotten her lab results from her last visit

## 2015-07-26 NOTE — Telephone Encounter (Signed)
LM about labs, will need to call Dr Verl Dicker office in the AM to see if earlier appt is available.

## 2015-07-27 NOTE — Telephone Encounter (Signed)
Called [facility] and they will try to see her tomorrow at 2 pm

## 2015-07-30 ENCOUNTER — Encounter: Payer: Self-pay | Admitting: Family Medicine

## 2015-08-19 IMAGING — US US OB COMP LESS 14 WK
1 series · 14 of 23 positions shown · non-contrast
Comparison: None.

CLINICAL DATA: Dating.

EXAM:
OBSTETRIC <14 WK ULTRASOUND
TECHNIQUE: Transabdominal ultrasound was performed for evaluation of the
gestation as well as the maternal uterus and adnexal regions.

[Series 1: us ob comp less 14 wks · 14 of 23 slices shown]
[im 1/23]
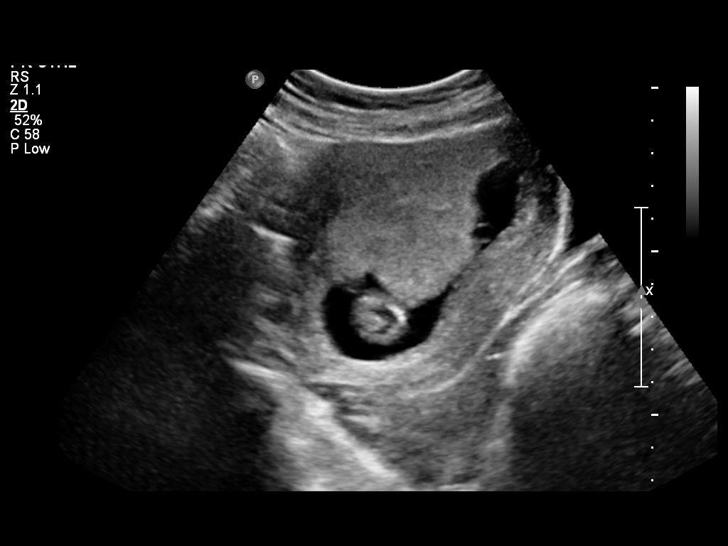
[im 3/23]
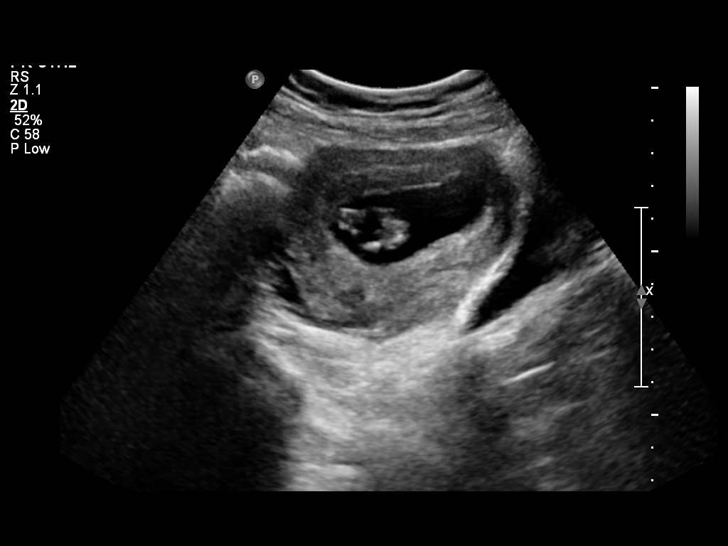
[im 5/23]
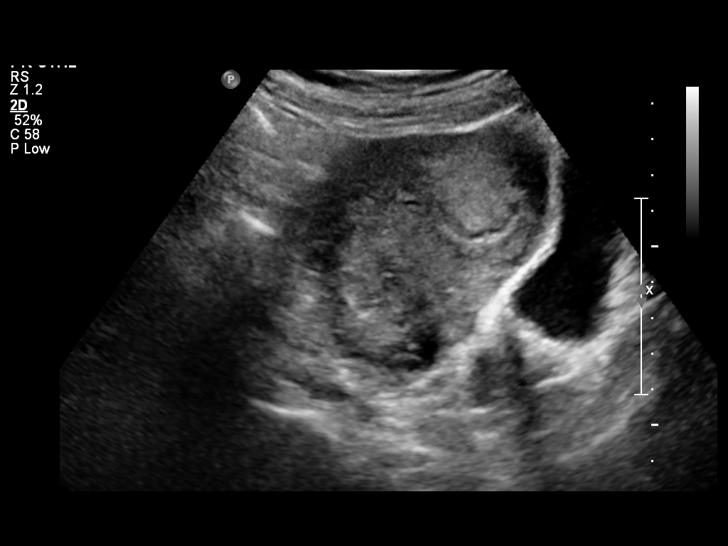
[im 6/23]
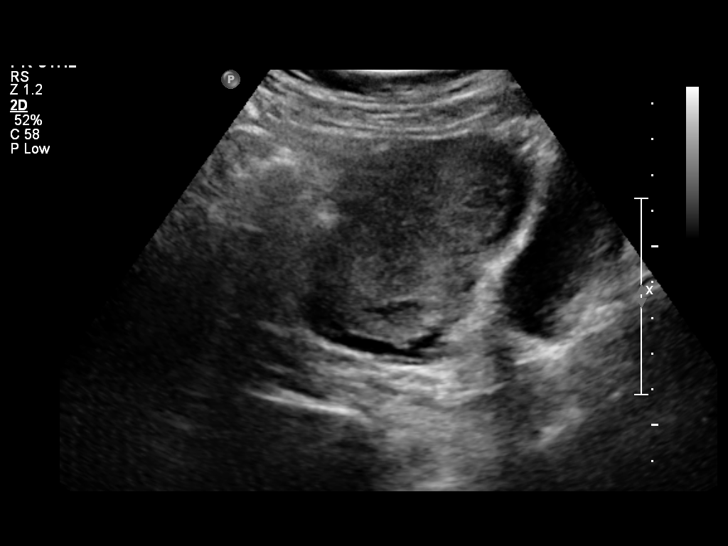
[im 8/23]
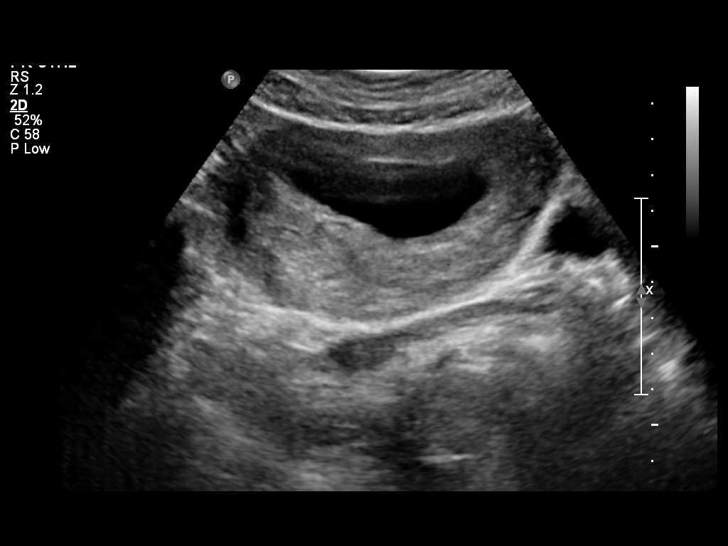
[im 10/23]
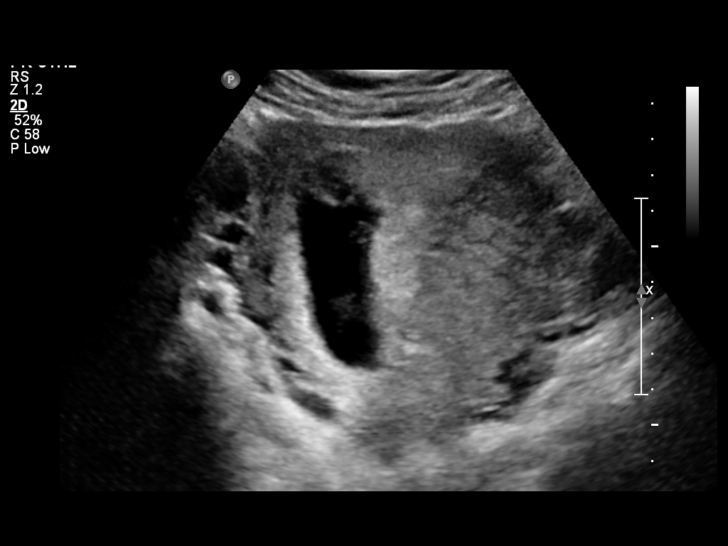
[im 11/23]
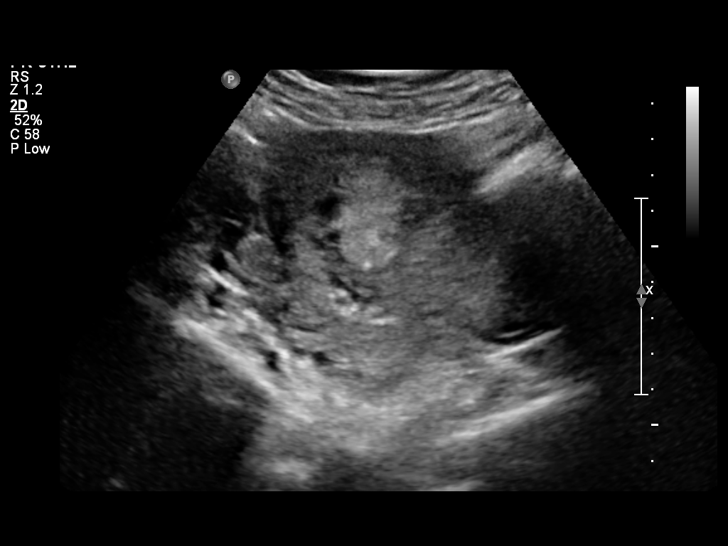
[im 13/23]
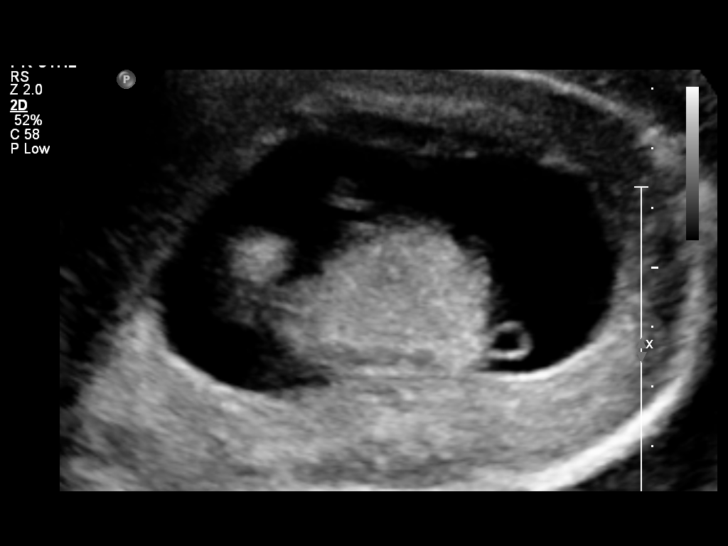
[im 14/23]
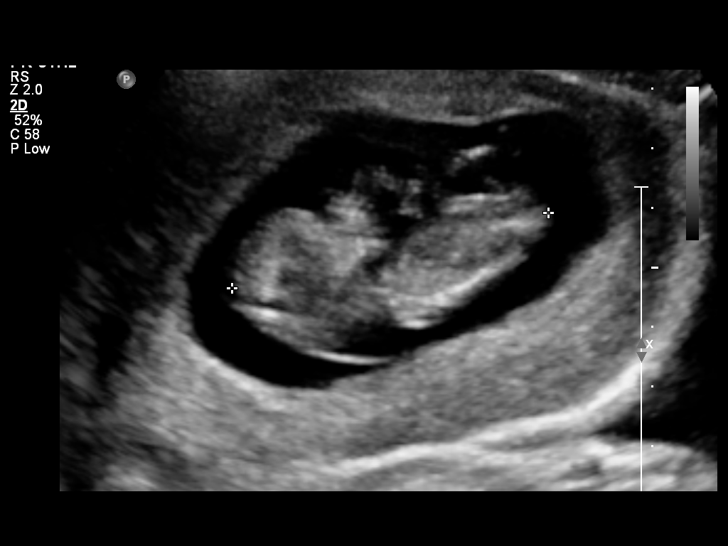
[im 16/23]
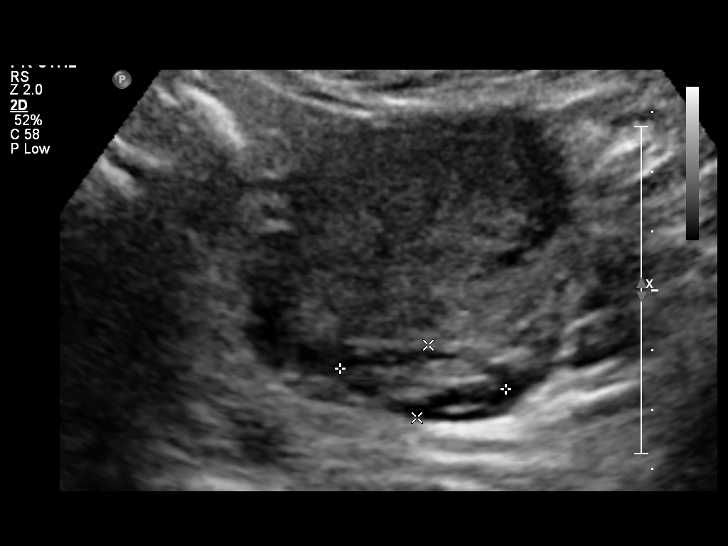
[im 18/23]
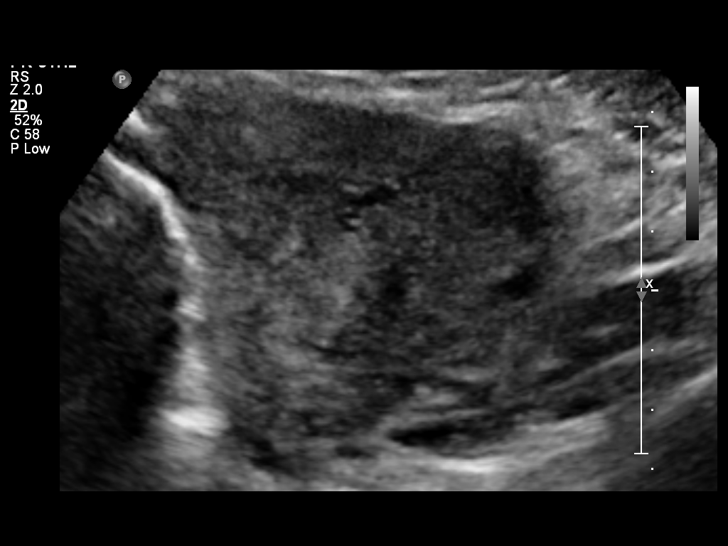
[im 19/23]
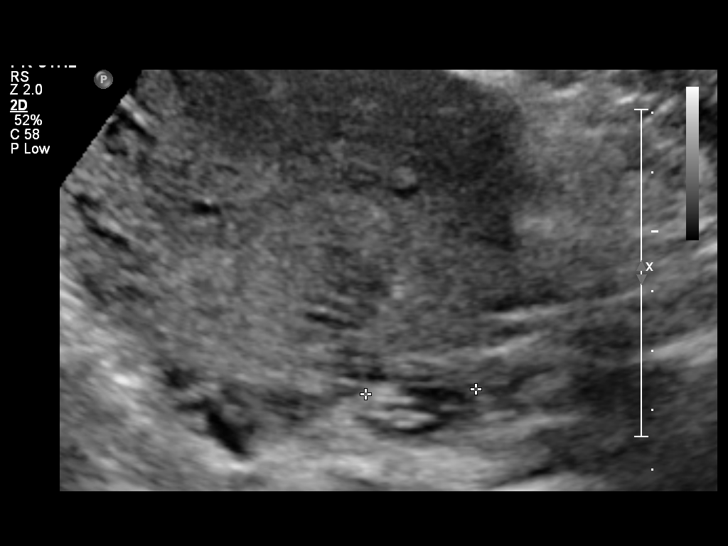
[im 21/23]
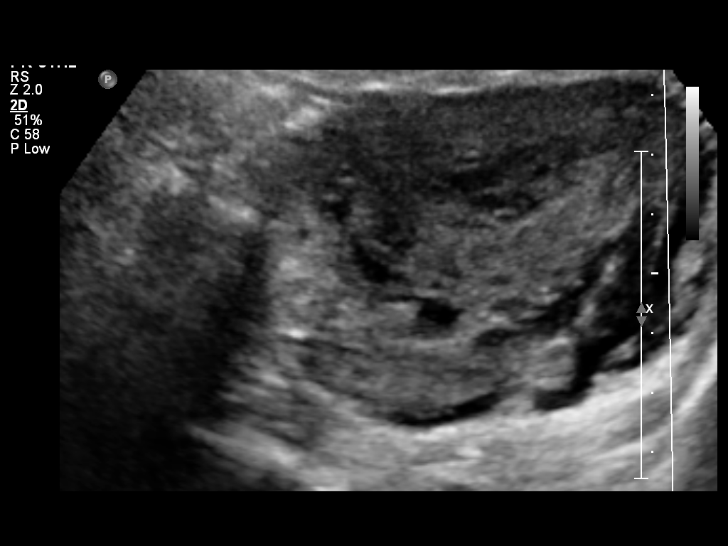
[im 23/23]
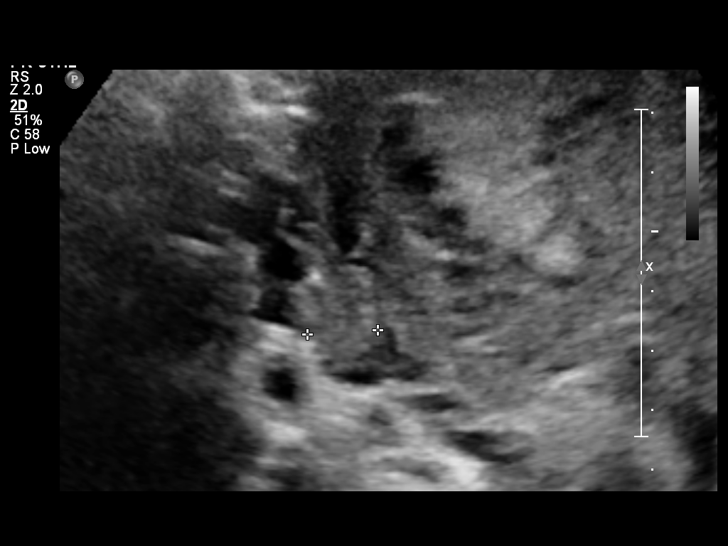

[14 of 23 positions shown; findings below may reference images not displayed]

FINDINGS: Intrauterine gestational sac: Visualized/normal in shape.

Yolk sac:  Visualized

Embryo:  Visualized

Cardiac Activity: Visualized

Heart Rate: 176 bpm

MSD:   mm    w     d

CRL:   53.7  mm   12 w 1 d                  US EDC: 08/11/2014

Maternal uterus/adnexae: No subchorionic hemorrhage. No adnexal
masses. No free fluid.
IMPRESSION: Twelve week 1 day intrauterine pregnancy by crown-rump length. Fetal
heart rate 176 beats per min. No acute maternal findings.

## 2015-08-23 ENCOUNTER — Ambulatory Visit (INDEPENDENT_AMBULATORY_CARE_PROVIDER_SITE_OTHER): Payer: 59 | Admitting: Medical

## 2015-08-23 ENCOUNTER — Encounter: Payer: Self-pay | Admitting: Medical

## 2015-08-23 VITALS — BP 119/61 | HR 82 | Temp 98.9°F | Wt 155.4 lb

## 2015-08-23 DIAGNOSIS — Z308 Encounter for other contraceptive management: Secondary | ICD-10-CM | POA: Diagnosis not present

## 2015-08-23 DIAGNOSIS — Z3046 Encounter for surveillance of implantable subdermal contraceptive: Secondary | ICD-10-CM

## 2015-08-23 NOTE — Progress Notes (Signed)
Patient ID: Monique Patrick, female   DOB: Jun 29, 1986, 29 y.o.   MRN: [id number]  GYNECOLOGY CLINIC PROCEDURE NOTE  Ms. Eiliana Drone is a 29 y.o. [id number] here for Nexplanon removal. Nexplanon was placed by Dr. Erin Fulling on 09/30/14. No GYN concerns.  Last pap smear was on 01/21/14 and was normal.  No other gynecologic concerns.  Nexplanon Removal Patient was given informed consent for removal of her Nexplanon.  Appropriate time out taken. Nexplanon site identified.  Area prepped in usual sterile fashon. One ml of 1% lidocaine was used to anesthetize the area at the distal end of the implant. A small stab incision was made right beside the implant on the distal portion.  The Nexplanon rod was grasped using hemostats and removed without difficulty.  There was minimal blood loss. There were no complications.  A small amount of antibiotic ointment and steri-strips were applied over the small incision.  A pressure bandage was applied to reduce any bruising.  The patient tolerated the procedure well and was given post procedure instructions.  Patient is planning to use condoms for contraception until she decides with her spouse on a new method of birth control.   Marny Lowenstein, PA-C 08/23/2015 3:47 PM

## 2015-08-23 NOTE — Progress Notes (Signed)
Pt would like to have nexplanon removed.

## 2015-08-23 NOTE — Patient Instructions (Signed)
Contraception Choices  Birth control (contraception) is the use of any methods or devices to stop pregnancy from happening. Below are some methods to help avoid pregnancy.  HORMONAL BIRTH CONTROL  · A small tube put under the skin of the upper arm (implant). The tube can stay in place for 3 years. The implant must be taken out after 3 years.  · Shots given every 3 months.  · Pills taken every day.  · Patches that are changed once a week.  · A ring put into the vagina (vaginal ring). The ring is left in place for 3 weeks and removed for 1 week. Then, a new ring is put in the vagina.  · Emergency birth control pills taken after unprotected sex (intercourse).  BARRIER BIRTH CONTROL   · A thin covering worn on the penis (female condom) during sex.  · A soft, loose covering put into the vagina (female condom) before sex.  · A rubber bowl that sits over the cervix (diaphragm). The bowl must be made for you. The bowl is put into the vagina before sex. The bowl is left in place for 6 to 8 hours after sex.  · A small, soft cup that fits over the cervix (cervical cap). The cup must be made for you. The cup can be left in place for 48 hours after sex.  · A sponge that is put into the vagina before sex.  · A chemical that kills or stops sperm from getting into the cervix and uterus (spermicide). The chemical may be a cream, jelly, foam, or pill.  INTRAUTERINE (IUD) BIRTH CONTROL   · IUD birth control is a small, T-shaped piece of plastic. The plastic is put inside the uterus. There are 2 types of IUD:  ¨ Copper IUD. The IUD is covered in copper wire. The copper makes a fluid that kills sperm. It can stay in place for 10 years.  ¨ Hormone IUD. The hormone stops pregnancy from happening. It can stay in place for 5 years.  PERMANENT METHODS  · When the woman has her fallopian tubes sealed, tied, or blocked during surgery. This stops the egg from traveling to the uterus.  · The doctor places a small coil or insert into each fallopian  tube. This causes scar tissue to form and blocks the fallopian tubes.  · When the female has the tubes that carry sperm tied off (vasectomy).  NATURAL FAMILY PLANNING BIRTH CONTROL   · Natural family planning means not having sex or using barrier birth control on the days the woman could become pregnant.  · Use a calendar to keep track of the length of each period and know the days she can get pregnant.  · Avoid sex during ovulation.  · Use a thermometer to measure body temperature. Also watch for symptoms of ovulation.  · Time sex to be after the woman has ovulated.  Use condoms to help protect yourself against sexually transmitted infections (STIs). Do this no matter what type of birth control you use. Talk to your doctor about which type of birth control is best for you.  Document Released: 09/10/2009 Document Revised: 11/18/2013 Document Reviewed: 06/04/2013  [employer]® Patient Information ©2015 [employer]. This information is not intended to replace advice given to you by your health care provider. Make sure you discuss any questions you have with your health care provider.

## 2016-07-22 ENCOUNTER — Ambulatory Visit (INDEPENDENT_AMBULATORY_CARE_PROVIDER_SITE_OTHER): Payer: BLUE CROSS/BLUE SHIELD | Admitting: Family Medicine

## 2016-07-22 VITALS — BP 100/60 | HR 91 | Temp 98.8°F | Resp 16 | Ht 67.0 in | Wt 159.5 lb

## 2016-07-22 DIAGNOSIS — K5900 Constipation, unspecified: Secondary | ICD-10-CM | POA: Diagnosis not present

## 2016-07-22 DIAGNOSIS — K648 Other hemorrhoids: Secondary | ICD-10-CM

## 2016-07-22 MED ORDER — HYDROCORTISONE ACE-PRAMOXINE 1-1 % RE CREA: 1.0000 "application " | TOPICAL_CREAM | Freq: Four times a day (QID) | RECTAL | 0 refills | Status: DC

## 2016-07-22 MED ORDER — HYDROCORTISONE ACETATE 25 MG RE SUPP: RECTAL | 1 refills | Status: DC

## 2016-07-22 NOTE — Patient Instructions (Addendum)
Hemorrhoids Hemorrhoids are swollen veins around the rectum or anus. There are two types of hemorrhoids:   Internal hemorrhoids. These occur in the veins just inside the rectum. They may poke through to the outside and become irritated and painful.  External hemorrhoids. These occur in the veins outside the anus and can be felt as a painful swelling or hard lump near the anus. CAUSES  Pregnancy.   Obesity.   Constipation or diarrhea.   Straining to have a bowel movement.   Sitting for long periods on the toilet.  Heavy lifting or other activity that caused you to strain.  Anal intercourse. SYMPTOMS   Pain.   Anal itching or irritation.   Rectal bleeding.   Fecal leakage.   Anal swelling.   One or more lumps around the anus.  DIAGNOSIS  Your caregiver may be able to diagnose hemorrhoids by visual examination. Other examinations or tests that may be performed include:   Examination of the rectal area with a gloved hand (digital rectal exam).   Examination of anal canal using a small tube (scope).   A blood test if you have lost a significant amount of blood.  A test to look inside the colon (sigmoidoscopy or colonoscopy). TREATMENT Most hemorrhoids can be treated at home. However, if symptoms do not seem to be getting better or if you have a lot of rectal bleeding, your caregiver may perform a procedure to help make the hemorrhoids get smaller or remove them completely. Possible treatments include:   Placing a rubber band at the base of the hemorrhoid to cut off the circulation (rubber band ligation).   Injecting a chemical to shrink the hemorrhoid (sclerotherapy).   Using a tool to burn the hemorrhoid (infrared light therapy).   Surgically removing the hemorrhoid (hemorrhoidectomy).   Stapling the hemorrhoid to block blood flow to the tissue (hemorrhoid stapling).  HOME CARE INSTRUCTIONS   Eat foods with fiber, such as whole grains, beans,  nuts, fruits, and vegetables. Ask your doctor about taking products with added fiber in them (fibersupplements).  Increase fluid intake. Drink enough water and fluids to keep your urine clear or pale yellow.   Exercise regularly.   Go to the bathroom when you have the urge to have a bowel movement. Do not wait.   Avoid straining to have bowel movements.   Keep the anal area dry and clean. Use wet toilet paper or moist towelettes after a bowel movement.   Medicated creams and suppositories may be used or applied as directed.   Only take over-the-counter or prescription medicines as directed by your caregiver.   Take warm sitz baths for 15-20 minutes, 3-4 times a day to ease pain and discomfort.   Place ice packs on the hemorrhoids if they are tender and swollen. Using ice packs between sitz baths may be helpful.   Put ice in a plastic bag.   Place a towel between your skin and the bag.   Leave the ice on for 15-20 minutes, 3-4 times a day.   Do not use a donut-shaped pillow or sit on the toilet for long periods. This increases blood pooling and pain.  SEEK MEDICAL CARE IF:  You have increasing pain and swelling that is not controlled by treatment or medicine.  You have uncontrolled bleeding.  You have difficulty or you are unable to have a bowel movement.  You have pain or inflammation outside the area of the hemorrhoids. MAKE SURE YOU:  Understand these instructions.  Will watch your condition.  Will get help right away if you are not doing well or get worse.   This information is not intended to replace advice given to you by your health care provider. Make sure you discuss any questions you have with your health care provider.   Document Released: 11/10/2000 Document Revised: 10/30/2012 Document Reviewed: 09/17/2012 Elsevier Interactive Patient Education 2016 [employer]    IF you received an x-ray today, you will receive an invoice from  [facility]. Please contact [facility] at [phone] with questions or concerns regarding your invoice.   IF you received labwork today, you will receive an invoice from [facility]. Please contact [facility] at [phone] with questions or concerns regarding your invoice.   Our billing staff will not be able to assist you with questions regarding bills from these companies.  You will be contacted with the lab results as soon as they are available. The fastest way to get your results is to activate your My Chart account. Instructions are located on the last page of this paperwork. If you have not heard from us regarding the results in 2 weeks, please contact this office.      Constipation, Adult Constipation is when a person has fewer than three bowel movements a week, has difficulty having a bowel movement, or has stools that are dry, hard, or larger than normal. As people grow older, constipation is more common. A low-fiber diet, not taking in enough fluids, and taking certain medicines may make constipation worse.  CAUSES   Certain medicines, such as antidepressants, pain medicine, iron supplements, antacids, and water pills.   Certain diseases, such as diabetes, irritable bowel syndrome (IBS), thyroid disease, or depression.   Not drinking enough water.   Not eating enough fiber-rich foods.   Stress or travel.   Lack of physical activity or exercise.   Ignoring the urge to have a bowel movement.   Using laxatives too much.  SIGNS AND SYMPTOMS   Having fewer than three bowel movements a week.   Straining to have a bowel movement.   Having stools that are hard, dry, or larger than normal.   Feeling full or bloated.   Pain in the lower abdomen.   Not feeling relief after having a bowel movement.  DIAGNOSIS  Your health care provider will take a medical history and perform a physical exam. Further testing may be  done for severe constipation. Some tests may include:  A barium enema X-ray to examine your rectum, colon, and, sometimes, your small intestine.   A sigmoidoscopy to examine your lower colon.   A colonoscopy to examine your entire colon. TREATMENT  Treatment will depend on the severity of your constipation and what is causing it. Some dietary treatments include drinking more fluids and eating more fiber-rich foods. Lifestyle treatments may include regular exercise. If these diet and lifestyle recommendations do not help, your health care provider may recommend taking over-the-counter laxative medicines to help you have bowel movements. Prescription medicines may be prescribed if over-the-counter medicines do not work.  HOME CARE INSTRUCTIONS   Eat foods that have a lot of fiber, such as fruits, vegetables, whole grains, and beans.  Limit foods high in fat and processed sugars, such as french fries, hamburgers, cookies, candies, and soda.   A fiber supplement may be added to your diet if you cannot get enough fiber from foods.   Drink enough fluids to keep your urine clear or pale yellow.  Exercise regularly or as directed by your health care provider.   Go to the restroom when you have the urge to go. Do not hold it.   Only take over-the-counter or prescription medicines as directed by your health care provider. Do not take other medicines for constipation without talking to your health care provider first.  SEEK IMMEDIATE MEDICAL CARE IF:   You have bright red blood in your stool.   Your constipation lasts for more than 4 days or gets worse.   You have abdominal or rectal pain.   You have thin, pencil-like stools.   You have unexplained weight loss. MAKE SURE YOU:   Understand these instructions.  Will watch your condition.  Will get help right away if you are not doing well or get worse.   This information is not intended to replace advice given to you by  your health care provider. Make sure you discuss any questions you have with your health care provider.   Document Released: 08/11/2004 Document Revised: 12/04/2014 Document Reviewed: 08/25/2013 Elsevier Interactive Patient Education [employer]

## 2016-07-31 NOTE — Progress Notes (Signed)
By signing my name below I, Shelah LewandowskyJoseph Thomas, attest that this documentation has been prepared under the direction and in the presence of Norberto SorensonEva Shaw, MD. Electonically Signed. Shelah LewandowskyJoseph Thomas, Scribe 07/31/2016 at 11:36 AM  Subjective:    Patient ID: Monique Patrick, female    DOB: 01/01/1986, 30 y.o.   MRN: [id number]  Chief Complaint  Patient presents with  . Rectal Problems    HPI Monique Hotterhssan Killian is a 30 y.o. female who presents to the [facility] complaining of rectal problems. Tried preparation H reg and the hydrocortisone type of topical cream without improvement. Has very painful bowel movements ever since childbirth.  Minimal bleeding. No blood in stool Think she has internal hemorrhoids, does not feel anything on the outside. No prob prior to childbirth. Hx is provided mainly by husband today due to language barrer, they have their 28 yo with them as well.    Patient Active Problem List   Diagnosis Date Noted  . Hypotension 03/24/2015  . Urticaria 10/30/2012    Current Outpatient Prescriptions on File Prior to Visit  Medication Sig Dispense Refill  . ibuprofen (ADVIL,MOTRIN) 600 MG tablet Take 1 tablet (600 mg total) by mouth every 6 (six) hours as needed. 30 tablet 0  . Prenatal Vit-Fe Fumarate-FA (PRENATAL VITAMINS PLUS) 27-1 MG TABS Take 1 tablet by mouth daily. 30 tablet 12   No current facility-administered medications on file prior to visit.     No Known Allergies  [facility] 07/22/2016 07/23/2015  Decreased Interest 0 0  Down, Depressed, Hopeless 0 0  PHQ - 2 Score 0 0       Review of Systems  Constitutional: Negative for activity change, appetite change, chills, fever and unexpected weight change.  Respiratory: Negative for shortness of breath.   Cardiovascular: Negative for chest pain and leg swelling.  Gastrointestinal: Positive for anal bleeding, blood in stool and constipation. Negative for abdominal distention, abdominal pain,  diarrhea, nausea and vomiting.  Genitourinary: Negative for decreased urine volume, difficulty urinating and dysuria.  Musculoskeletal: Negative for gait problem.  Skin: Negative for rash.  Hematological: Negative for adenopathy.  Psychiatric/Behavioral: Negative for sleep disturbance.       Objective:   Physical Exam  Constitutional: She is oriented to person, place, and time. She appears well-developed and well-nourished. No distress.  HENT:  Head: Normocephalic and atraumatic.  Neck: Normal range of motion. Neck supple. No thyromegaly present.  Cardiovascular: Normal rate, regular rhythm, normal heart sounds and intact distal pulses.   Pulmonary/Chest: Effort normal and breath sounds normal. No respiratory distress.  Abdominal: Soft. Bowel sounds are normal. She exhibits no distension. There is no tenderness. There is no rebound, no guarding and no CVA tenderness.  Genitourinary: Rectal exam shows internal hemorrhoid and tenderness. Rectal exam shows no external hemorrhoid, no fissure, no mass and anal tone normal.  Musculoskeletal: She exhibits no edema.  Lymphadenopathy:    She has no cervical adenopathy.  Neurological: She is alert and oriented to person, place, and time.  Skin: Skin is warm and dry. She is not diaphoretic. No erythema.  Psychiatric: She has a normal mood and affect. Her behavior is normal.    BP 100/60   Pulse 91   Temp 98.8 F (37.1 C) (Oral)   Resp 16   Ht 5\' 7"  (1.702 m)   Wt 159 lb 8 oz (72.3 kg)   LMP 06/21/2016 (Approximate)   SpO2 99%   BMI 24.98 kg/m  Assessment & Plan:   1. Internal hemorrhoids   2. Constipation, unspecified constipation type   Pt has been using otc topical creams with minimal relief.  Recommended rectal suppository for more effective localized treatment of internal hemorrhoids but all rx suppositories seemed to be cost-prohibitive so try below and if sxs cont, refer to GI for further eval or RTC for eval with  rectoscope.  Meds ordered this encounter  Medications  . DISCONTD: hydrocortisone (ANUSOL-HC) 25 MG suppository    Sig: Place 1 suppository rectally after bowel movement up to two times a day for internal hemorrhoids    Dispense:  24 suppository    Refill:  1  . pramoxine-hydrocortisone (PROCTOCREAM-HC) 1-1 % rectal cream    Sig: Place 1 application rectally 4 (four) times daily. As needed for hemorrhoids    Dispense:  30 g    Refill:  0     Norberto Sorenson, M.D.  [facility] [address] [place], [place] [address] [phone] phone [phone] fax  07/31/16 11:36 AM

## 2016-08-13 ENCOUNTER — Encounter (HOSPITAL_COMMUNITY): Payer: Self-pay | Admitting: *Deleted

## 2016-08-13 ENCOUNTER — Emergency Department (HOSPITAL_COMMUNITY)
Admission: EM | Admit: 2016-08-13 | Discharge: 2016-08-13 | Disposition: A | Discharge status: Home / Self Care | Payer: BLUE CROSS/BLUE SHIELD | Attending: Emergency Medicine | Admitting: Emergency Medicine

## 2016-08-13 DIAGNOSIS — L509 Urticaria, unspecified: Secondary | ICD-10-CM

## 2016-08-13 DIAGNOSIS — L500 Allergic urticaria: Secondary | ICD-10-CM | POA: Diagnosis not present

## 2016-08-13 MED ORDER — FEXOFENADINE HCL 180 MG PO TABS: 180.0000 mg | ORAL_TABLET | Freq: Two times a day (BID) | ORAL | 1 refills | Status: DC | PRN

## 2016-08-13 MED ORDER — CETIRIZINE HCL 10 MG PO TABS: 10.0000 mg | ORAL_TABLET | Freq: Two times a day (BID) | ORAL | 1 refills | Status: DC | PRN

## 2016-08-13 MED ORDER — SODIUM CHLORIDE 0.9 % IV BOLUS (SEPSIS)
1000.0000 mL | Freq: Once | INTRAVENOUS | Status: AC
  Administered 2016-08-13: 1000 mL via INTRAVENOUS

## 2016-08-13 MED ORDER — DIPHENHYDRAMINE HCL 50 MG/ML IJ SOLN
25.0000 mg | Freq: Once | INTRAMUSCULAR | Status: AC
Start: 2016-08-13 — End: 2016-08-13
  Administered 2016-08-13: 25 mg via INTRAVENOUS
  Filled 2016-08-13: qty 1

## 2016-08-13 MED ORDER — PREDNISONE 20 MG PO TABS: 40.0000 mg | ORAL_TABLET | Freq: Every day | ORAL | 0 refills | Status: DC

## 2016-08-13 MED ORDER — DIPHENHYDRAMINE HCL 50 MG/ML IJ SOLN
25.0000 mg | Freq: Once | INTRAMUSCULAR | Status: AC
  Administered 2016-08-13: 25 mg via INTRAVENOUS
  Filled 2016-08-13: qty 1

## 2016-08-13 MED ORDER — METHYLPREDNISOLONE SODIUM SUCC 125 MG IJ SOLR
125.0000 mg | Freq: Once | INTRAMUSCULAR | Status: AC
  Administered 2016-08-13: 125 mg via INTRAVENOUS
  Filled 2016-08-13: qty 2

## 2016-08-13 MED ORDER — FAMOTIDINE IN NACL 20-0.9 MG/50ML-% IV SOLN
20.0000 mg | Freq: Once | INTRAVENOUS | Status: AC
  Administered 2016-08-13: 20 mg via INTRAVENOUS
  Filled 2016-08-13: qty 50

## 2016-08-13 MED ORDER — LORATADINE 10 MG PO TABS
10.0000 mg | ORAL_TABLET | Freq: Once | ORAL | Status: AC
  Administered 2016-08-13: 10 mg via ORAL
  Filled 2016-08-13: qty 1

## 2016-08-13 NOTE — ED Triage Notes (Signed)
The pt is c/o a rash that she has every year at this time although there is no rash on her body visible.  She points to her throat and reports that she feels like there is a lump in her throat.   She is drinking water with no difficulty at present  lmp one week ago

## 2016-08-13 NOTE — Discharge Instructions (Signed)
Take the medications prescribed to you, as needed, for persistent symptoms of rash. Follow up with your primary care doctor regarding your visit to the emergency department today. Return for any new or concerning symptoms.

## 2016-08-13 NOTE — ED Notes (Signed)
Pt ambulated to restroom with this RN with no difficulty.  

## 2016-08-13 NOTE — ED Provider Notes (Signed)
[facility]EMERGENCY DEPT Provider Note   CSN: [id number] Arrival date & time: 08/13/16  0345     History   Chief Complaint Chief Complaint  Patient presents with  . Allergic Reaction    HPI Monique Patrick is a 30 y.o. female.  30 year old female presents to the emergency department for evaluation of an urticarial rash. Patient states that she has had a similar rash in the fall season over the past few years. Rash is red and itching. Patient reports seeing an allergist in the past. She was placed on a combination of Allegra and Zyrtec which she would take at the first site of a rash for treatment. She states that she ran out of these medications recently. She has had symptoms a rash over the past week which have been worsening. She also reports feeling a lump in her throat tonight. She has not had any drooling, inability to swallow, or shortness of breath. No recent fevers. She denies any new topical contacts or ingestions.   The history is provided by the patient. No language interpreter was used.  Allergic Reaction  Presenting symptoms: rash     Past Medical History:  Diagnosis Date  . Allergy     Patient Active Problem List   Diagnosis Date Noted  . Hypotension 03/24/2015  . Urticaria 10/30/2012    History reviewed. No pertinent surgical history.  OB History    Gravida Para Term Preterm AB Living   2 2 2     2    SAB TAB Ectopic Multiple Live Births           2       Home Medications    Prior to Admission medications   Medication Sig Start Date End Date Taking? Authorizing Provider  ibuprofen (ADVIL,MOTRIN) 600 MG tablet Take 1 tablet (600 mg total) by mouth every 6 (six) hours as needed. 03/24/15   Nelva Nay, MD  pramoxine-hydrocortisone (PROCTOCREAM-HC) 1-1 % rectal cream Place 1 application rectally 4 (four) times daily. As needed for hemorrhoids 07/22/16   Sherren Mocha, MD  Prenatal Vit-Fe Fumarate-FA (PRENATAL VITAMINS PLUS) 27-1 MG TABS Take 1 tablet by mouth  daily. 01/21/14   Hurshel Party, CNM    Family History Family History  Problem Relation Age of Onset  . Diabetes Father   . Diabetes Maternal Grandmother   . Diabetes Paternal Grandfather     Social History Social History  Substance Use Topics  . Smoking status: Never Smoker  . Smokeless tobacco: Never Used  . Alcohol use No     Allergies   Review of patient's allergies indicates no known allergies.   Review of Systems Review of Systems  Constitutional: Negative for fever.  HENT: Negative for drooling.   Respiratory: Negative for shortness of breath.   Gastrointestinal: Negative for nausea and vomiting.  Skin: Positive for rash.  Ten systems reviewed and are negative for acute change, except as noted in the HPI.     Physical Exam Updated Vital Signs BP (!) 136/121   Pulse 109   Temp 98.3 F (36.8 C)   Resp 16   LMP 08/06/2016   SpO2 100%   Physical Exam  Constitutional: She is oriented to person, place, and time. She appears well-developed and well-nourished. No distress.  Nontoxic appearing and in no distress  HENT:  Head: Normocephalic and atraumatic.  Oropharynx clear. No angioedema visible. Patient tolerating secretions without difficulty.  Eyes: Conjunctivae and EOM are normal. No scleral icterus.  Neck: Normal  range of motion.  Cardiovascular: Regular rhythm and intact distal pulses.   Mild tachycardia  Pulmonary/Chest: Effort normal. No respiratory distress. She has no wheezes. She has no rales.  Respirations even and unlabored. No stridor. No wheezing.  Musculoskeletal: Normal range of motion.  Neurological: She is alert and oriented to person, place, and time.  GCS 15. Patient moving all extremities.  Skin: Skin is warm and dry. Rash noted. She is not diaphoretic. No erythema. No pallor.  Macular, pruritic, erythematous, blanching, slightly raised and planar rash to bilateral extremities. Rash consistent with urticaria. Negative Nikolsky  sign. No vesicles or bullae. No pustules.  Psychiatric: Her behavior is normal.  Mildly anxious  Nursing note and vitals reviewed.    ED Treatments / Results  Labs (all labs ordered are listed, but only abnormal results are displayed) Labs Reviewed - No data to display  EKG  EKG Interpretation None       Radiology No results found.  Procedures Procedures (including critical care time)  Medications Ordered in ED Medications  famotidine (PEPCID) IVPB 20 mg premix (20 mg Intravenous New Bag/Given 08/13/16 0512)  diphenhydrAMINE (BENADRYL) injection 25 mg (25 mg Intravenous Given 08/13/16 0512)  methylPREDNISolone sodium succinate (SOLU-MEDROL) 125 mg/2 mL injection 125 mg (125 mg Intravenous Given 08/13/16 0512)  sodium chloride 0.9 % bolus 1,000 mL (1,000 mLs Intravenous New Bag/Given 08/13/16 0504)     Initial Impression / Assessment and Plan / ED Course  I have reviewed the triage vital signs and the nursing notes.  Pertinent labs & imaging results that were available during my care of the patient were reviewed by me and considered in my medical decision making (see chart for details).  Clinical Course    6:25 AM Symptoms improving. Patient with mild sensation of "lump in throat". She Is still tolerating her secretions without difficulty. She has no signs of respiratory distress. No hypoxia. Husband has pictures of prescription bottles which were given to the patient previously by her allergist. These prescriptions include fexofenadine 180 mg tablets to take twice a day as well as cetirizine 10 mg tablets to take twice a day. We will provide these prescriptions for outpatient management as well as prednisone taper. Additional IV dose of Benadryl ordered for management of residual symptoms. Anticipate discharge if improvement continues.   Final Clinical Impressions(s) / ED Diagnoses   Final diagnoses:  Urticaria    New Prescriptions New Prescriptions   CETIRIZINE  (ZYRTEC ALLERGY) 10 MG TABLET    Take 1 tablet (10 mg total) by mouth 2 (two) times daily as needed for allergies.   FEXOFENADINE (ALLEGRA ALLERGY) 180 MG TABLET    Take 1 tablet (180 mg total) by mouth 2 (two) times daily as needed for allergies.   PREDNISONE (DELTASONE) 20 MG TABLET    Take 2 tablets (40 mg total) by mouth daily. Take 40 mg by mouth daily for 3 days, then 20mg  by mouth daily for 3 days, then 10mg  daily for 3 days     Antony MaduraKelly Williamson Cavanah, PA-C 08/13/16 [id number]    Derwood KaplanAnkit Nanavati, MD 08/13/16 [id number]

## 2016-08-13 NOTE — ED Notes (Signed)
Following benadryl administration pt reports dizziness.

## 2016-08-13 NOTE — ED Provider Notes (Signed)
7:47 AM Patient signed out to me at shift change. Patient with urticaria and lump sensation in her throat. Was treated with Solu-Medrol, Benadryl, Pepcid, Claritin. Patient was monitored. She feels much better. Rash resolved. No itching or throat swelling sensation. She is comfortable going home. We'll discharge home with Zyrtec and Allegra prescribed by PA Humes. Pt is mildly tachycardic, but normal BP.  Vitals:   08/13/16 0545 08/13/16 0600 08/13/16 0615 08/13/16 0630  BP: 116/65 117/66 118/58 118/65  Pulse: 107 108 111 108  Resp:      Temp:      TempSrc:      SpO2: 100% 100% 100% 98%      Jaynie Crumbleatyana Travin Marik, PA-C 08/13/16 0750

## 2016-08-14 ENCOUNTER — Emergency Department (HOSPITAL_COMMUNITY)
Admission: EM | Admit: 2016-08-14 | Discharge: 2016-08-14 | Disposition: A | Discharge status: Home / Self Care | Payer: BLUE CROSS/BLUE SHIELD | Attending: Emergency Medicine | Admitting: Emergency Medicine

## 2016-08-14 ENCOUNTER — Encounter (HOSPITAL_COMMUNITY): Payer: Self-pay | Admitting: Emergency Medicine

## 2016-08-14 DIAGNOSIS — L500 Allergic urticaria: Secondary | ICD-10-CM | POA: Insufficient documentation

## 2016-08-14 DIAGNOSIS — L509 Urticaria, unspecified: Secondary | ICD-10-CM

## 2016-08-14 LAB — I-STAT BETA HCG BLOOD, ED (MC, WL, AP ONLY): I-stat hCG, quantitative: <5 m[IU]/mL (ref <5)

## 2016-08-14 MED ORDER — DIPHENHYDRAMINE HCL 50 MG/ML IJ SOLN
50.0000 mg | Freq: Once | INTRAMUSCULAR | Status: AC
Start: 2016-08-14 — End: 2016-08-14
  Administered 2016-08-14: 50 mg via INTRAVENOUS
  Filled 2016-08-14: qty 1

## 2016-08-14 MED ORDER — SODIUM CHLORIDE 0.9 % IV SOLN
1000.0000 mL | Freq: Once | INTRAVENOUS | Status: AC
  Administered 2016-08-14: 1000 mL via INTRAVENOUS

## 2016-08-14 MED ORDER — FAMOTIDINE IN NACL 20-0.9 MG/50ML-% IV SOLN: 20.0000 mg | Freq: Once | INTRAVENOUS | Status: DC

## 2016-08-14 MED ORDER — METHYLPREDNISOLONE SODIUM SUCC 125 MG IJ SOLR
125.0000 mg | Freq: Once | INTRAMUSCULAR | Status: AC
  Administered 2016-08-14: 125 mg via INTRAVENOUS
  Filled 2016-08-14: qty 2

## 2016-08-14 MED ORDER — DIPHENHYDRAMINE HCL 50 MG/ML IJ SOLN: 50.0000 mg | Freq: Once | INTRAMUSCULAR | Status: DC

## 2016-08-14 MED ORDER — SODIUM CHLORIDE 0.9 % IV BOLUS (SEPSIS): 1000.0000 mL | Freq: Once | INTRAVENOUS | Status: DC

## 2016-08-14 MED ORDER — DIPHENHYDRAMINE HCL 25 MG PO CAPS: 25.0000 mg | ORAL_CAPSULE | Freq: Four times a day (QID) | ORAL | 0 refills | Status: DC | PRN

## 2016-08-14 MED ORDER — FAMOTIDINE IN NACL 20-0.9 MG/50ML-% IV SOLN
20.0000 mg | Freq: Once | INTRAVENOUS | Status: AC
  Administered 2016-08-14: 20 mg via INTRAVENOUS
  Filled 2016-08-14: qty 50

## 2016-08-14 MED ORDER — METHYLPREDNISOLONE SODIUM SUCC 125 MG IJ SOLR: 125.0000 mg | Freq: Once | INTRAMUSCULAR | Status: DC

## 2016-08-14 NOTE — ED Triage Notes (Addendum)
Pt arrived to triage very anxious.  Her husband is translating and states that pt cannot breathe and feels like her throat is closing.  Allergic reaction to unknown source.  Pt has a bottle of water in her hands and husband reports she is unable to drink the water due to throat tightness.  States pt was seen in ED for same last night and given medication that relieved symptoms.  Reports generalized rash x 1 week.  Husband states symptoms started again 1 hour ago and that pt took 1 pill (without any relief of symptoms) that was prescribed last night but unsure what it was.

## 2016-08-14 NOTE — Discharge Instructions (Signed)
To find a primary care or specialty doctor please call [phone] or [phone] to access "[facility] Find a Doctor Service." ° °You may also go on the [facility] website at [email or web address] ° °There are also multiple [facility] and [facility] practices throughout the [place] that are frequently accepting new patients. You may find a clinic that is close to your home and contact them. ° °[facility] -  °[address] °[place] [address] °[phone] ° °[facility] in [place] (also locations in [place] and [place]) -  °[address] °[place] [address] [phone] ° °[facility] -  °[address] °[place] [address] [phone] ° ° °

## 2016-08-14 NOTE — ED Provider Notes (Signed)
TIME SEEN: 4:50 AM  CHIEF COMPLAINT: Hives  HPI: Patient is a 30 year old female with history of allergies, urticaria who presents to the emergency department with complaints of hives that started just prior to arrival and feeling like there is something stuck in her throat. No difficulty swallowing, breathing, speaking. No wheezing. No lightheadedness. No new exposures including no new soaps, lotions, detergents, medications, foods or pet exposures. Has had similar symptoms before and has seen an allergy specialist.  Was seen in the emergency department yesterday for the same and improved after Benadryl, Pepcid and Solu-Medrol. Was discharged on Zyrtec, Allegra and prednisone taper.  ROS: See HPI Constitutional: no fever  Eyes: no drainage  ENT: no runny nose   Cardiovascular:  no chest pain  Resp: no SOB  GI: no vomiting GU: no dysuria Integumentary: no rash  Allergy:  hives  Musculoskeletal: no leg swelling  Neurological: no slurred speech ROS otherwise negative  PAST MEDICAL HISTORY/PAST SURGICAL HISTORY:  Past Medical History:  Diagnosis Date  . Allergy     MEDICATIONS:  Prior to Admission medications   Medication Sig Start Date End Date Taking? Authorizing Provider  cetirizine (ZYRTEC ALLERGY) 10 MG tablet Take 1 tablet (10 mg total) by mouth 2 (two) times daily as needed for allergies. 08/13/16   Antony MaduraKelly Humes, PA-C  Chlorpheniramine Maleate (ALLERGY PO) Take 1 tablet by mouth 2 (two) times daily as needed (itching/allergies).    Historical Provider, MD  fexofenadine (ALLEGRA ALLERGY) 180 MG tablet Take 1 tablet (180 mg total) by mouth 2 (two) times daily as needed for allergies. 08/13/16   Antony MaduraKelly Humes, PA-C  predniSONE (DELTASONE) 20 MG tablet Take 2 tablets (40 mg total) by mouth daily. Take 40 mg by mouth daily for 3 days, then 20mg  by mouth daily for 3 days, then 10mg  daily for 3 days 08/13/16   Antony MaduraKelly Humes, PA-C    ALLERGIES:  No Known Allergies  SOCIAL HISTORY:  Social  History  Substance Use Topics  . Smoking status: Never Smoker  . Smokeless tobacco: Never Used  . Alcohol use No    FAMILY HISTORY: Family History  Problem Relation Age of Onset  . Diabetes Father   . Diabetes Maternal Grandmother   . Diabetes Paternal Grandfather     EXAM: BP 124/71   Pulse 117   Temp 98.1 F (36.7 C) (Oral)   Resp 16   LMP 08/06/2016   SpO2 100%  CONSTITUTIONAL: Alert and oriented and responds appropriately to questions. Well-appearing; well-nourished HEAD: Normocephalic EYES: Conjunctivae clear, PERRL ENT: normal nose; no rhinorrhea; moist mucous membranes; No pharyngeal erythema or petechiae, no tonsillar hypertrophy or exudate, no uvular deviation, no trismus or drooling, normal phonation, no stridor, no dental caries present, no drainable dental abscess noted, no Ludwig's angina, tongue sits flat in the bottom of the mouth, no angioedema, no facial erythema or warmth, no facial swelling NECK: Supple, no meningismus, no LAD  CARD: RRR; S1 and S2 appreciated; no murmurs, no clicks, no rubs, no gallops RESP: Normal chest excursion without splinting or tachypnea; breath sounds clear and equal bilaterally; no wheezes, no rhonchi, no rales, no hypoxia or respiratory distress, speaking full sentences ABD/GI: Normal bowel sounds; non-distended; soft, non-tender, no rebound, no guarding, no peritoneal signs BACK:  The back appears normal and is non-tender to palpation, there is no CVA tenderness EXT: Normal ROM in all joints; non-tender to palpation; no edema; normal capillary refill; no cyanosis, no calf tenderness or swelling    SKIN:  Normal color for age and race; warm; scattered urticaria, no petechiae or purpura, no blisters or desquamation, no sign of cellulitis NEURO: Moves all extremities equally, sensation to light touch intact diffusely, cranial nerves II through XII intact PSYCH: The patient's mood and manner are appropriate. Grooming and personal hygiene  are appropriate.  MEDICAL DECISION MAKING: Patient here urticaria and feeling like there is something stuck in her throat. She has normal voice, no angioedema, no difficulty swallowing, speaking or breathing. Hemodynamically stable. I do not feel she needs epinephrine at this time. We'll give IV Benadryl, Solu-Medrol, Pepcid and IV fluids and reassess.  ED PROGRESS: 7:30 AM  Patient's symptoms have resolved.  HCG negative. Patient reports feeling much better. We'll have her continue Allegra only and stop Zyrtec. We'll have her use Benadryl as needed. Will have her continue her steroid taper. We'll give her outpatient allergy follow-up information. Discussed return precautions with patient and her husband. They verbalize understanding and are comfortable with this plan.    At this time, I do not feel there is any life-threatening condition present. I have reviewed and discussed all results (EKG, imaging, lab, urine as appropriate), exam findings with patient/family. I have reviewed nursing notes and appropriate previous records.  I feel the patient is safe to be discharged home without further emergent workup and can continue workup as an outpatient as needed. Discussed usual and customary return precautions. Patient/family verbalize understanding and are comfortable with this plan.  Outpatient follow-up has been provided. All questions have been answered.    EKG Interpretation  Date/Time:  Monday August 14 2016 05:01:10 EDT Ventricular Rate:  104 PR Interval:    QRS Duration: 121 QT Interval:  342 QTC Calculation: 450 R Axis:   55 Text Interpretation:  Sinus tachycardia Nonspecific intraventricular conduction delay Nonspecific T abnrm, anterolateral leads Baseline wander in lead(s) V3 No significant change since last tracing Confirmed by WARD,  DO, KRISTEN [phone]) on 08/14/2016 6:25:25 AM         Layla Maw Ward, DO 08/14/16 0730

## 2016-08-15 ENCOUNTER — Ambulatory Visit (INDEPENDENT_AMBULATORY_CARE_PROVIDER_SITE_OTHER): Payer: BLUE CROSS/BLUE SHIELD | Admitting: Family Medicine

## 2016-08-15 ENCOUNTER — Ambulatory Visit: Payer: BLUE CROSS/BLUE SHIELD

## 2016-08-15 VITALS — BP 100/74 | HR 90 | Temp 98.2°F | Resp 16 | Ht 67.0 in | Wt 161.0 lb

## 2016-08-15 DIAGNOSIS — L509 Urticaria, unspecified: Secondary | ICD-10-CM | POA: Diagnosis not present

## 2016-08-15 MED ORDER — PREDNISONE 20 MG PO TABS: ORAL_TABLET | ORAL | 0 refills | Status: DC

## 2016-08-15 MED ORDER — METHYLPREDNISOLONE SODIUM SUCC 125 MG IJ SOLR
125.0000 mg | Freq: Once | INTRAMUSCULAR | Status: AC
  Administered 2016-08-15: 125 mg via INTRAMUSCULAR

## 2016-08-15 NOTE — Patient Instructions (Addendum)
  Prednisone- start taking tomorrow 3 tablets for 3 days.  Then take 2 tablet for 3 days.  Then take 1 tablet for 3 days. You may take cetirizine or fexofenadine, with benadryl as needed.  Follow up tomorrow, can cancel if improved.  Go to ED with shortness of breath, throat swelling that does not go away, unable to take food.     IF you received an x-ray today, you will [facility]. Please contact [facility] at [phone] with questions or concerns regarding your invoice.   IF you received labwork today, you will receive an invoice from [facility]. Please contact [facility] at [phone] with questions or concerns regarding your invoice.   Our billing staff will not be able to assist you with questions regarding bills from these companies.  You will be contacted with the lab results as soon as they are available. The fastest way to get your results is to activate your My Chart account. Instructions are located on the last page of this paperwork. If you have not heard from us regarding the results in 2 weeks, please contact this office.

## 2016-08-15 NOTE — Progress Notes (Deleted)
   Subjective:    Patient ID: Monique Patrick, female    DOB: 1986/06/27, 30 y.o.   MRN: [id number]  HPI  Presents for worsening urticaria, has been to the ED twice for this.  She was having trouble with throat swelling and skin itching, which improved with steroids, H2 blockers, anti-histamines.  She had symptoms again hours after visit.  She presents today for worsening symptoms again.  She has not been taking her medications, except for benadryl.  She has not taken prednisone and fexofenadine or cetirizine.  She reports mild throat itching.  She is able to breath and swallow her secretions.  Denies shortness of breath, chest pain.  Has recurring itching and skin rash.  She has not eaten much today.  She has seen an allergist in the past, but they have closed their practice.  She needs to establish with a new one.      Review of Systems     Objective:   Physical Exam        Assessment & Plan:  Urticaria Due to worsening symptoms, administered solumedrol in office.  Gave extra prednisone to help with controlling symptoms.  She will take 60 mg for 3 days, 40 mg for 3 days, 20 mg for 3 days.  Continue cetirizine or fexofenadine.  Can take benadryl PRN.  Follow up tomorrow to recheck symptoms.  If normal, can cancel.  Gave precautions of going to ED with shortness of breath, throat closing, unable to swallow.  Patient was counseled reviewing diagnosis and treatment in detail, totaling in 40  minutes, over half of which was spent in face to face counseling.

## 2016-08-15 NOTE — Assessment & Plan Note (Signed)
Due to worsening symptoms, administered solumedrol in office.  Gave extra prednisone to help with controlling symptoms.  She will take 60 mg for 3 days, 40 mg for 3 days, 20 mg for 3 days.  Continue cetirizine or fexofenadine.  Can take benadryl PRN.  Follow up tomorrow to recheck symptoms.  If normal, can cancel.  Gave precautions of going to ED with shortness of breath, throat closing, unable to swallow.

## 2016-08-17 NOTE — Progress Notes (Signed)
Patient discussed and examined with Dr. Zachery DauerBarnes. Agree with findings, assessment and plan of care per her note. No stridor or apparent airway compromise. ER/911 precautions were discussed.

## 2016-08-17 NOTE — Progress Notes (Signed)
   Subjective:    Patient ID: Monique Patrick, female    DOB: 12-24-1985, 30 y.o.   MRN: [id number]  HPI  Presents for worsening urticaria, has been to the ED twice for this.  She was having trouble with throat swelling and skin itching, which improved with steroids, H2 blockers, anti-histamines.  She had symptoms again hours after visit.  She presents today for worsening symptoms again.  She has not been taking her medications, except for benadryl.  She has not taken prednisone and fexofenadine or cetirizine.  She reports mild throat itching.  She is able to breath and swallow her secretions.  Denies shortness of breath, chest pain.  Has recurring itching and skin rash.  She has not eaten much today.  She has seen an allergist in the past, but they have closed their practice.  She needs to establish with a new one.      Review of Systems  Constitutional: Negative for chills, fatigue and fever.  HENT: Positive for trouble swallowing. Negative for congestion, rhinorrhea and voice change.   Respiratory: Negative for apnea, cough, choking, chest tightness, shortness of breath, wheezing and stridor.   Cardiovascular: Negative for chest pain and leg swelling.  Gastrointestinal: Negative for abdominal pain and nausea.  Genitourinary: Negative for dysuria and urgency.  Musculoskeletal: Negative for arthralgias and joint swelling.  Skin: Positive for color change and rash. Negative for wound.  Allergic/Immunologic:       Hives all over body and itching  Psychiatric/Behavioral: Negative for agitation and confusion.  All other systems reviewed and are negative.      Objective:   Physical Exam  Constitutional: She is oriented to person, place, and time. She appears well-developed and well-nourished. No distress.  HENT:  Head: Normocephalic and atraumatic.  Right Ear: External ear normal.  Left Ear: External ear normal.  Neck: Normal range of motion. Neck supple.  Cardiovascular: Normal rate,  regular rhythm, normal heart sounds and intact distal pulses.  Exam reveals no gallop and no friction rub.   No murmur heard. Pulmonary/Chest: Effort normal and breath sounds normal. No respiratory distress. She has no wheezes. She has no rales. She exhibits no tenderness.  Musculoskeletal: Normal range of motion. She exhibits no edema.  Neurological: She is alert and oriented to person, place, and time.  Skin: Skin is warm. Rash noted. She is not diaphoretic. There is erythema.  Reveals present with some maculopapular erythematous rash diffusely throughout body over her upper and lower extremities her back in her trunk. None seen in the buccal mucosa.  Psychiatric: She has a normal mood and affect. Her behavior is normal. Judgment and thought content normal.  Nursing note and vitals reviewed.         Assessment & Plan:  Urticaria Due to worsening symptoms, administered solumedrol in office.  Gave extra prednisone to help with controlling symptoms.  She will take 60 mg for 3 days, 40 mg for 3 days, 20 mg for 3 days.  Continue cetirizine or fexofenadine.  Can take benadryl PRN.  Follow up tomorrow to recheck symptoms.  If normal, can cancel.  Gave precautions of going to ED with shortness of breath, throat closing, unable to swallow.  Patient was counseled reviewing diagnosis and treatment in detail, totaling in 40  minutes, over half of which was spent in face to face counseling.  Signed,  Corliss MarcusAlicia Barnes, DO [facility]

## 2016-10-10 ENCOUNTER — Ambulatory Visit (INDEPENDENT_AMBULATORY_CARE_PROVIDER_SITE_OTHER): Payer: BLUE CROSS/BLUE SHIELD | Admitting: Family Medicine

## 2016-10-10 VITALS — BP 104/68 | HR 70 | Temp 98.2°F | Resp 18 | Ht 67.0 in | Wt 158.6 lb

## 2016-10-10 DIAGNOSIS — K6289 Other specified diseases of anus and rectum: Secondary | ICD-10-CM

## 2016-10-10 DIAGNOSIS — H6123 Impacted cerumen, bilateral: Secondary | ICD-10-CM | POA: Diagnosis not present

## 2016-10-10 DIAGNOSIS — H9203 Otalgia, bilateral: Secondary | ICD-10-CM | POA: Diagnosis not present

## 2016-10-10 DIAGNOSIS — J029 Acute pharyngitis, unspecified: Secondary | ICD-10-CM | POA: Diagnosis not present

## 2016-10-10 DIAGNOSIS — J069 Acute upper respiratory infection, unspecified: Secondary | ICD-10-CM

## 2016-10-10 LAB — POCT RAPID STREP A (OFFICE): Rapid Strep A Screen: NEGATIVE

## 2016-10-10 MED ORDER — NEOMYCIN-POLYMYXIN-HC 3.5-10000-1 OT SOLN: 3.0000 [drp] | Freq: Three times a day (TID) | OTIC | 0 refills | Status: DC

## 2016-10-10 NOTE — Progress Notes (Addendum)
By signing my name below, I, Mesha Guinyard, attest that this documentation has been prepared under the direction and in the presence of Meredith StaggersJeffrey Kauri Garson, MD.  Electronically Signed: Arvilla MarketMesha Guinyard, Medical Scribe. 10/10/16. 12:13 PM.  Subjective:    Patient ID: Monique Patrick, female    DOB: 12/11/1985, 30 y.o.   MRN: [id number]  HPI No chief complaint on file.   HPI Comments: Monique Patrick is a 30 y.o. female who presents to the [facility] complaining of sore throat onset 3 days. Reports ears pain, cough, congestion, subjective fever, watery eyes with crusting in the morning. Took several OTC medications without relief to her symptoms. Her 2 kids have been sick with viral ear infections that isn't strep throat, and her husband came in the office for similar symptoms. Denies using q-tips to clean her ears.  Internal Hemorrhoids: As we were finishing up visit she also wanted to discuss hemorrhoids. She was seen by Dr. Clelia CroftShaw Aug 26th. Hx of hemorrhoids since child birth. Rx procto cream HC 1%, but advised if symptoms persist, may need GI eval. Pt hasn't found any change in her hemorrhoid symptoms since Aug and states the cream given to her didn't give her relief. Her hemorrhoids are the same as last time, there is no blood found with wiping, and there is pain when she sits. Pt has been taking fiber supplements and it's been helping her constipation. She's been having hard stools.  Patient Active Problem List   Diagnosis Date Noted  . Hypotension 03/24/2015  . Urticaria 10/30/2012   Past Medical History:  Diagnosis Date  . Allergy    No past surgical history on file. No Known Allergies Prior to Admission medications   Medication Sig Start Date End Date Taking? Authorizing Provider  Chlorpheniramine Maleate (ALLERGY PO) Take 1 tablet by mouth 2 (two) times daily as needed (itching/allergies).    Historical Provider, MD  diphenhydrAMINE (BENADRYL) 25 mg capsule Take 1-2  capsules (25-50 mg total) by mouth every 6 (six) hours as needed. 08/14/16   Kristen N Ward, DO  fexofenadine (ALLEGRA ALLERGY) 180 MG tablet Take 1 tablet (180 mg total) by mouth 2 (two) times daily as needed for allergies. 08/13/16   Antony MaduraKelly Humes, PA-C  pramoxine-hydrocortisone (PROCTOCREAM-HC) 1-1 % rectal cream PLACE 1 APPLICATION RECTALLY QID 07/24/16   Historical Provider, MD  predniSONE (DELTASONE) 20 MG tablet Take 2 tablets (40 mg total) by mouth daily. Take 40 mg by mouth daily for 3 days, then 20mg  by mouth daily for 3 days, then 10mg  daily for 3 days 08/13/16   Antony MaduraKelly Humes, PA-C  predniSONE (DELTASONE) 20 MG tablet Take 3 tablets PO daily for 3 days, then continue previous taper. 08/15/16   Gershon CullAlicia B Chitanand, DO   Social History   Social History  . Marital status: Married    Spouse name: N/A  . Number of children: N/A  . Years of education: N/A   Occupational History  . Not on file.   Social History Main Topics  . Smoking status: Never Smoker  . Smokeless tobacco: Never Used  . Alcohol use No  . Drug use: No  . Sexual activity: No     Comment: desires Implanon   Other Topics Concern  . Not on file   Social History Narrative  . No narrative on file   Review of Systems  Constitutional: Positive for fever.  HENT: Positive for congestion, ear pain and sore throat.   Eyes: Positive for discharge.  Respiratory: Positive for cough.   Gastrointestinal: Negative for anal bleeding and constipation.   Objective:  Physical Exam  Constitutional: She is oriented to person, place, and time. She appears well-developed and well-nourished. No distress.  HENT:  Head: Normocephalic and atraumatic.  Right Ear: Hearing, tympanic membrane and external ear normal.  Left Ear: Hearing, tympanic membrane and external ear normal.  Nose: Nose normal.  Mouth/Throat: Oropharynx is clear and moist. No oropharyngeal exudate or posterior oropharyngeal erythema.  Visible canal was clear without  erythema or edema, but both canals are obstructed by cerumen Abrasion but no bleeding on right external canal; no canal edema, no exudate, and TM pearly greay  Eyes: EOM are normal. Pupils are equal, round, and reactive to light. Right conjunctiva is injected. Left conjunctiva is injected. Scleral icterus (minimal bilaterally) is present.  No exudate at the canthi  Neck:  Tender along the [facility] nodes, but they are not enlarged  Cardiovascular: Normal rate, regular rhythm, normal heart sounds and intact distal pulses.   No murmur heard. Pulmonary/Chest: Effort normal and breath sounds normal. No respiratory distress. She has no wheezes. She has no rhonchi.  Neurological: She is alert and oriented to person, place, and time.  Skin: Skin is warm and dry. No rash noted.  Psychiatric: She has a normal mood and affect. Her behavior is normal.  Vitals reviewed.  BP 104/68 (BP Location: Right Arm, Patient Position: Sitting, Cuff Size: Small)   Pulse 70   Temp 98.2 F (36.8 C) (Oral)   Resp 18   Ht 5\' 7"  (1.702 m)   Wt 158 lb 9.6 oz (71.9 kg)   SpO2 100%   BMI 24.84 kg/m    Lavage performed, improved hearing. Small abrasion on the right external canal without bleeding. Assessment & Plan:    Monique Patrick is a 30 y.o. female Acute upper respiratory infection Sore throat - Plan: POCT rapid strep A, Culture, Group A Strep Bilateral impacted cerumen - Plan: Ear wax removal Ear pain, bilateral  - Lavage performed with improvement with hearing and ear symptoms.  -Suspected viral URI, symptomatic care discussed, throat culture pending, RTC precautions discussed.  Rectal pain - Plan: Ambulatory referral to Gastroenterology  - No fissures or external hemorrhoids seen, and if internal hemorrhoids, should not have significant discomfort. No apparent perianal fissures or abscess, but will refer to gastroenterology for further evaluation plus or minus colonoscopy/endoscopy to determine cause of  discomfort.  - Constipation prevention discussed, RTC precautions    Meds ordered this encounter  Medications  . neomycin-polymyxin-hydrocortisone (CORTISPORIN) otic solution    Sig: Place 3 drops into the right ear 3 (three) times daily.    Dispense:  10 mL    Refill:  0   Patient Instructions    You likely have a viral infection. Saline nasal spray atleast 4 times per day for nasal congestion, artificial eyedrops such as refresh or blink if needed, over the counter mucinex or mucinex DM, drink plenty of fluids.  Return to the clinic or go to the nearest emergency room if any of your symptoms worsen or new symptoms occur.  You also have excess cerumen or ear wax in both ears. Make sure you do not put anything into the ears as that can make the problem worse. See information on this below.  Small abrasion on right ear canal- 3 drops of antibiotic three times per day. Return to the clinic or go to the nearest emergency room if any of your symptoms  worsen or new symptoms occur.  .  For hemorrhoids/rectal pain, I will refer you to a gastroenterologist to determine why you persistently have this pain, as we did not see any concerns on your exam today. Make sure that you are drinking plenty of water and fiber in the diet, Colace as a stool softener if needed to prevent constipation.  Return to the clinic or go to the nearest emergency room if any of your symptoms worsen or new symptoms occur.    Earwax Buildup Your ears make a substance called earwax. It may also be called cerumen. Sometimes, too much earwax builds up in your ear canal. This can cause ear pain and make it harder for you to hear. CAUSES This condition is caused by too much earwax production or buildup. RISK FACTORS The following factors may make you more likely to develop this condition:  Cleaning your ears often with swabs.  Having narrow ear canals.  Having earwax that is overly thick or sticky.  Having  eczema.  Being dehydrated. SYMPTOMS Symptoms of this condition include:  Reduced hearing.  Ear drainage.  Ear pain.  Ear itch.  A feeling of fullness in the ear or feeling that the ear is plugged.  Ringing in the ear.  Coughing. DIAGNOSIS Your health care provider can diagnose this condition based on your symptoms and medical history. Your health care provider will also do an ear exam to look inside your ear with a scope (otoscope). You may also have a hearing test. TREATMENT Treatment for this condition includes:  Over-the-counter or prescription ear drops to soften the earwax.  Earwax removal by a health care provider. This may be done:  By flushing the ear with body-temperature water.  With a medical instrument that has a loop at the end (earwax curette).  With a suction device. HOME CARE INSTRUCTIONS  Take over-the-counter and prescription medicines only as told by your health care provider.  Do not put any objects, including an ear swab, into your ear. You can clean the opening of your ear canal with a washcloth.  Drink enough water to keep your urine clear or pale yellow.  If you have frequent earwax buildup or you use hearing aids, consider seeing your health care provider every 6-12 months for routine preventive ear cleanings. Keep all follow-up visits as told by your health care provider. SEEK MEDICAL CARE IF:  You have ear pain.  Your condition does not improve with treatment.  You have hearing loss.  You have blood, pus, or other fluid coming from your ear. This information is not intended to replace advice given to you by your health care provider. Make sure you discuss any questions you have with your health care provider. Document Released: 12/21/2004 Document Revised: 03/06/2016 Document Reviewed: 06/30/2015 Elsevier Interactive Patient Education  2017 [employer]  Upper Respiratory Infection, Adult Most upper respiratory infections (URIs) are  a viral infection of the air passages leading to the lungs. A URI affects the nose, throat, and upper air passages. The most common type of URI is nasopharyngitis and is typically referred to as "the common cold." URIs run their course and usually go away on their own. Most of the time, a URI does not require medical attention, but sometimes a bacterial infection in the upper airways can follow a viral infection. This is called a secondary infection. Sinus and middle ear infections are common types of secondary upper respiratory infections. Bacterial pneumonia can also complicate a URI. A URI  can worsen asthma and chronic obstructive pulmonary disease (COPD). Sometimes, these complications can require emergency medical care and may be life threatening. What are the causes? Almost all URIs are caused by viruses. A virus is a type of germ and can spread from one person to another. What increases the risk? You may be at risk for a URI if:  You smoke.  You have chronic heart or lung disease.  You have a weakened defense (immune) system.  You are very young or very old.  You have nasal allergies or asthma.  You work in crowded or poorly ventilated areas.  You work in health care facilities or schools. What are the signs or symptoms? Symptoms typically develop 2-3 days after you come in contact with a cold virus. Most viral URIs last 7-10 days. However, viral URIs from the influenza virus (flu virus) can last 14-18 days and are typically more severe. Symptoms may include:  Runny or stuffy (congested) nose.  Sneezing.  Cough.  Sore throat.  Headache.  Fatigue.  Fever.  Loss of appetite.  Pain in your forehead, behind your eyes, and over your cheekbones (sinus pain).  Muscle aches. How is this diagnosed? Your health care provider may diagnose a URI by:  Physical exam.  Tests to check that your symptoms are not due to another condition such as:  Strep  throat.  Sinusitis.  Pneumonia.  Asthma. How is this treated? A URI goes away on its own with time. It cannot be cured with medicines, but medicines may be prescribed or recommended to relieve symptoms. Medicines may help:  Reduce your fever.  Reduce your cough.  Relieve nasal congestion. Follow these instructions at home:  Take medicines only as directed by your health care provider.  Gargle warm saltwater or take cough drops to comfort your throat as directed by your health care provider.  Use a warm mist humidifier or inhale steam from a shower to increase air moisture. This may make it easier to breathe.  Drink enough fluid to keep your urine clear or pale yellow.  Eat soups and other clear broths and maintain good nutrition.  Rest as needed.  Return to work when your temperature has returned to normal or as your health care provider advises. You may need to stay home longer to avoid infecting others. You can also use a face mask and careful hand washing to prevent spread of the virus.  Increase the usage of your inhaler if you have asthma.  Do not use any tobacco products, including cigarettes, chewing tobacco, or electronic cigarettes. If you need help quitting, ask your health care provider. How is this prevented? The best way to protect yourself from getting a cold is to practice good hygiene.  Avoid oral or hand contact with people with cold symptoms.  Wash your hands often if contact occurs. There is no clear evidence that vitamin C, vitamin E, echinacea, or exercise reduces the chance of developing a cold. However, it is always recommended to get plenty of rest, exercise, and practice good nutrition. Contact a health care provider if:  You are getting worse rather than better.  Your symptoms are not controlled by medicine.  You have chills.  You have worsening shortness of breath.  You have brown or red mucus.  You have yellow or brown nasal  discharge.  You have pain in your face, especially when you bend forward.  You have a fever.  You have swollen neck glands.  You have pain  while swallowing.  You have white areas in the back of your throat. Get help right away if:  You have severe or persistent:  Headache.  Ear pain.  Sinus pain.  Chest pain.  You have chronic lung disease and any of the following:  Wheezing.  Prolonged cough.  Coughing up blood.  A change in your usual mucus.  You have a stiff neck.  You have changes in your:  Vision.  Hearing.  Thinking.  Mood. This information is not intended to replace advice given to you by your health care provider. Make sure you discuss any questions you have with your health care provider. Document Released: 05/09/2001 Document Revised: 07/16/2016 Document Reviewed: 02/18/2014 Elsevier Interactive Patient Education  2017 [employer].    IF you received an x-ray today, you will receive an invoice from [facility]. Please contact [facility] at [phone] with questions or concerns regarding your invoice.   IF you received labwork today, you will receive an invoice from [employer]. Please contact [facility] at [phone] with questions or concerns regarding your invoice.   Our billing staff will not be able to assist you with questions regarding bills from these companies.  You will be contacted with the lab results as soon as they are available. The fastest way to get your results is to activate your My Chart account. Instructions are located on the last page of this paperwork. If you have not heard from [place] regarding the results in 2 weeks, please contact this office.        I personally performed the services described in this documentation, which was scribed in my presence. The recorded information has been reviewed and considered, and addended by me as needed.   Signed,   Meredith Staggers,  MD [facility].  10/10/16 1:46 PM

## 2016-10-10 NOTE — Progress Notes (Signed)
GU Exam - no fissure, no external hemorrhoid   Monique LennertSarah Demarious Kapur PA-C  [facility] 10/10/2016 12:42 PM

## 2016-10-10 NOTE — Patient Instructions (Addendum)
You likely have a viral infection. Saline nasal spray atleast 4 times per day for nasal congestion, artificial eyedrops such as refresh or blink if needed, over the counter mucinex or mucinex DM, drink plenty of fluids.  Return to the clinic or go to the nearest emergency room if any of your symptoms worsen or new symptoms occur.  You also have excess cerumen or ear wax in both ears. Make sure you do not put anything into the ears as that can make the problem worse. See information on this below.  Small abrasion on right ear canal- 3 drops of antibiotic three times per day. Return to the clinic or go to the nearest emergency room if any of your symptoms worsen or new symptoms occur.  .  For hemorrhoids/rectal pain, I will refer you to a gastroenterologist to determine why you persistently have this pain, as we did not see any concerns on your exam today. Make sure that you are drinking plenty of water and fiber in the diet, Colace as a stool softener if needed to prevent constipation.  Return to the clinic or go to the nearest emergency room if any of your symptoms worsen or new symptoms occur.    Earwax Buildup Your ears make a substance called earwax. It may also be called cerumen. Sometimes, too much earwax builds up in your ear canal. This can cause ear pain and make it harder for you to hear. CAUSES This condition is caused by too much earwax production or buildup. RISK FACTORS The following factors may make you more likely to develop this condition:  Cleaning your ears often with swabs.  Having narrow ear canals.  Having earwax that is overly thick or sticky.  Having eczema.  Being dehydrated. SYMPTOMS Symptoms of this condition include:  Reduced hearing.  Ear drainage.  Ear pain.  Ear itch.  A feeling of fullness in the ear or feeling that the ear is plugged.  Ringing in the ear.  Coughing. DIAGNOSIS Your health care provider can diagnose this condition based on  your symptoms and medical history. Your health care provider will also do an ear exam to look inside your ear with a scope (otoscope). You may also have a hearing test. TREATMENT Treatment for this condition includes:  Over-the-counter or prescription ear drops to soften the earwax.  Earwax removal by a health care provider. This may be done:  By flushing the ear with body-temperature water.  With a medical instrument that has a loop at the end (earwax curette).  With a suction device. HOME CARE INSTRUCTIONS  Take over-the-counter and prescription medicines only as told by your health care provider.  Do not put any objects, including an ear swab, into your ear. You can clean the opening of your ear canal with a washcloth.  Drink enough water to keep your urine clear or pale yellow.  If you have frequent earwax buildup or you use hearing aids, consider seeing your health care provider every 6-12 months for routine preventive ear cleanings. Keep all follow-up visits as told by your health care provider. SEEK MEDICAL CARE IF:  You have ear pain.  Your condition does not improve with treatment.  You have hearing loss.  You have blood, pus, or other fluid coming from your ear. This information is not intended to replace advice given to you by your health care provider. Make sure you discuss any questions you have with your health care provider. Document Released: 12/21/2004 Document Revised: 03/06/2016 Document  Reviewed: 06/30/2015 Elsevier Interactive Patient Education  2017 Elsevier Inc.  Upper Respiratory Infection, Adult Most upper respiratory infections (URIs) are a viral infection of the air passages leading to the lungs. A URI affects the nose, throat, and upper air passages. The most common type of URI is nasopharyngitis and is typically referred to as "the common cold." URIs run their course and usually go away on their own. Most of the time, a URI does not require medical  attention, but sometimes a bacterial infection in the upper airways can follow a viral infection. This is called a secondary infection. Sinus and middle ear infections are common types of secondary upper respiratory infections. Bacterial pneumonia can also complicate a URI. A URI can worsen asthma and chronic obstructive pulmonary disease (COPD). Sometimes, these complications can require emergency medical care and may be life threatening. What are the causes? Almost all URIs are caused by viruses. A virus is a type of germ and can spread from one person to another. What increases the risk? You may be at risk for a URI if:  You smoke.  You have chronic heart or lung disease.  You have a weakened defense (immune) system.  You are very young or very old.  You have nasal allergies or asthma.  You work in crowded or poorly ventilated areas.  You work in health care facilities or schools. What are the signs or symptoms? Symptoms typically develop 2-3 days after you come in contact with a cold virus. Most viral URIs last 7-10 days. However, viral URIs from the influenza virus (flu virus) can last 14-18 days and are typically more severe. Symptoms may include:  Runny or stuffy (congested) nose.  Sneezing.  Cough.  Sore throat.  Headache.  Fatigue.  Fever.  Loss of appetite.  Pain in your forehead, behind your eyes, and over your cheekbones (sinus pain).  Muscle aches. How is this diagnosed? Your health care provider may diagnose a URI by:  Physical exam.  Tests to check that your symptoms are not due to another condition such as:  Strep throat.  Sinusitis.  Pneumonia.  Asthma. How is this treated? A URI goes away on its own with time. It cannot be cured with medicines, but medicines may be prescribed or recommended to relieve symptoms. Medicines may help:  Reduce your fever.  Reduce your cough.  Relieve nasal congestion. Follow these instructions at  home:  Take medicines only as directed by your health care provider.  Gargle warm saltwater or take cough drops to comfort your throat as directed by your health care provider.  Use a warm mist humidifier or inhale steam from a shower to increase air moisture. This may make it easier to breathe.  Drink enough fluid to keep your urine clear or pale yellow.  Eat soups and other clear broths and maintain good nutrition.  Rest as needed.  Return to work when your temperature has returned to normal or as your health care provider advises. You may need to stay home longer to avoid infecting others. You can also use a face mask and careful hand washing to prevent spread of the virus.  Increase the usage of your inhaler if you have asthma.  Do not use any tobacco products, including cigarettes, chewing tobacco, or electronic cigarettes. If you need help quitting, ask your health care provider. How is this prevented? The best way to protect yourself from getting a cold is to practice good hygiene.  Avoid oral or hand contact  with people with cold symptoms.  Wash your hands often if contact occurs. There is no clear evidence that vitamin C, vitamin E, echinacea, or exercise reduces the chance of developing a cold. However, it is always recommended to get plenty of rest, exercise, and practice good nutrition. Contact a health care provider if:  You are getting worse rather than better.  Your symptoms are not controlled by medicine.  You have chills.  You have worsening shortness of breath.  You have brown or red mucus.  You have yellow or brown nasal discharge.  You have pain in your face, especially when you bend forward.  You have a fever.  You have swollen neck glands.  You have pain while swallowing.  You have white areas in the back of your throat. Get help right away if:  You have severe or persistent:  Headache.  Ear pain.  Sinus pain.  Chest pain.  You have  chronic lung disease and any of the following:  Wheezing.  Prolonged cough.  Coughing up blood.  A change in your usual mucus.  You have a stiff neck.  You have changes in your:  Vision.  Hearing.  Thinking.  Mood. This information is not intended to replace advice given to you by your health care provider. Make sure you discuss any questions you have with your health care provider. Document Released: 05/09/2001 Document Revised: 07/16/2016 Document Reviewed: 02/18/2014 Elsevier Interactive Patient Education  2017 [employer]    IF you received an x-ray today, you will receive an invoice from [facility]. Please contact [facility] at [phone] with questions or concerns regarding your invoice.   IF you received labwork today, you will receive an invoice from [facility]. Please contact [facility] at [phone] with questions or concerns regarding your invoice.   Our billing staff will not be able to assist you with questions regarding bills from these companies.  You will be contacted with the lab results as soon as they are available. The fastest way to get your results is to activate your My Chart account. Instructions are located on the last page of this paperwork. If you have not heard from us regarding the results in 2 weeks, please contact this office.

## 2016-10-12 LAB — CULTURE, GROUP A STREP: Organism ID, Bacteria: NORMAL

## 2016-10-17 ENCOUNTER — Telehealth: Payer: Self-pay

## 2016-10-17 NOTE — Telephone Encounter (Signed)
Husband called on behalf of his wife.  He said that the specialist we referred him to is booked out until next year.  He is wondering if there is a different doctor or practice she can go to to be seen quicker.  Please advise  [phone]

## 2016-10-17 NOTE — Telephone Encounter (Signed)
Sent to [facility]

## 2016-10-30 ENCOUNTER — Encounter: Payer: Self-pay | Admitting: Physician Assistant

## 2016-10-30 ENCOUNTER — Encounter: Payer: BLUE CROSS/BLUE SHIELD | Admitting: Nurse Practitioner

## 2016-10-30 NOTE — Progress Notes (Signed)
Pre visit review using our clinic review tool, if applicable. No additional management support is needed unless otherwise documented below in the visit note. 

## 2016-11-01 NOTE — Progress Notes (Signed)
This encounter was created in error - please disregard.

## 2016-11-08 ENCOUNTER — Ambulatory Visit (INDEPENDENT_AMBULATORY_CARE_PROVIDER_SITE_OTHER): Payer: BLUE CROSS/BLUE SHIELD | Admitting: Physician Assistant

## 2016-11-08 ENCOUNTER — Encounter: Payer: Self-pay | Admitting: Physician Assistant

## 2016-11-08 VITALS — BP 118/76 | HR 80 | Ht 63.0 in | Wt 162.0 lb

## 2016-11-08 DIAGNOSIS — K6289 Other specified diseases of anus and rectum: Secondary | ICD-10-CM | POA: Diagnosis not present

## 2016-11-08 DIAGNOSIS — K602 Anal fissure, unspecified: Secondary | ICD-10-CM | POA: Diagnosis not present

## 2016-11-08 MED ORDER — AMBULATORY NON FORMULARY MEDICATION: 0.1250 mg | Freq: Three times a day (TID) | 1 refills | Status: DC

## 2016-11-08 NOTE — Patient Instructions (Signed)
We have sent a prescription for nitroglycerin 0.125% gel to [facility]. You should apply a pea size amount to your rectum three times daily x 6-8 weeks.  [facility]'s information is below: Address: [address], [place], [place] [address]  [phone]   

## 2016-11-08 NOTE — Progress Notes (Signed)
Chief Complaint: Rectal pain  HPI:  Monique Patrick is a 30 year old female who was referred to me by Shade FloodGreene, Jeffrey R, MD for a complaint of rectal pain.    Today, the patient presents to clinic accompanied by an interpreter, her husband and young daughter. She tells me that for the past 2 years she has been experiencing rectal pain which is worse when she sits down as well as when she has a bowel movement. She describes this pain as a severe discomfort that is sometimes sharp in nature, rated as a 7-8/10. She has been to her primary care doctor on 3 separate occasions according to her husband for this pain and was given steroid creams for suspected internal hemorrhoids, the patient tells me she has been using these over the past 2 years with no relief in symptoms at all. The patient does tell me that she is occasionally constipated and will take a fiber supplement on those days, this occurs 2-3 times per week. This pain apparently started right after she gave birth naturally to her daughter.   Patient denies fever, chills, blood in her stool, melena, change in diet, weight loss, fatigue, nausea, vomiting, heartburn, reflux or abdominal pain.  Past Medical History:  Diagnosis Date  . Allergy     History reviewed. No pertinent surgical history.  Current Outpatient Prescriptions  Medication Sig Dispense Refill  . diphenhydrAMINE (BENADRYL) 25 mg capsule Take 1-2 capsules (25-50 mg total) by mouth every 6 (six) hours as needed. (Patient not taking: Reported on 11/08/2016) 30 capsule 0  . fexofenadine (ALLEGRA ALLERGY) 180 MG tablet Take 1 tablet (180 mg total) by mouth 2 (two) times daily as needed for allergies. (Patient not taking: Reported on 11/08/2016) 60 tablet 1  . neomycin-polymyxin-hydrocortisone (CORTISPORIN) otic solution Place 3 drops into the right ear 3 (three) times daily. (Patient not taking: Reported on 11/08/2016) 10 mL 0  . pramoxine-hydrocortisone (PROCTOCREAM-HC) 1-1 % rectal  cream PLACE 1 APPLICATION RECTALLY QID  0  . predniSONE (DELTASONE) 20 MG tablet Take 2 tablets (40 mg total) by mouth daily. Take 40 mg by mouth daily for 3 days, then 20mg  by mouth daily for 3 days, then 10mg  daily for 3 days (Patient not taking: Reported on 11/08/2016) 12 tablet 0  . predniSONE (DELTASONE) 20 MG tablet Take 3 tablets PO daily for 3 days, then continue previous taper. (Patient not taking: Reported on 11/08/2016) 9 tablet 0   No current facility-administered medications for this visit.     Allergies as of 11/08/2016  . (No Known Allergies)    Family History  Problem Relation Age of Onset  . Diabetes Father   . Diabetes Maternal Grandmother   . Diabetes Paternal Grandfather     Social History   Social History  . Marital status: Married    Spouse name: N/A  . Number of children: 2  . Years of education: N/A   Occupational History  . Not on file.   Social History Main Topics  . Smoking status: Never Smoker  . Smokeless tobacco: Never Used  . Alcohol use No  . Drug use: No  . Sexual activity: No     Comment: desires Implanon   Other Topics Concern  . Not on file   Social History Narrative  . No narrative on file    Review of Systems:    Constitutional: No weight loss, fever or chills Respiratory: No SOB  Gastrointestinal: See HPI and otherwise negative Psychiatric: No history of  depression or anxiety   Physical Exam:  Vital signs: BP 118/76   Pulse 80   Ht 5\' 3"  (1.6 m)   Wt 162 lb (73.5 kg)   BMI 28.70 kg/m    Constitutional:   Pleasant female appears to be in NAD, Well developed, Well nourished, alert and cooperative  Respiratory: Respirations even and unlabored. Lungs clear to auscultation bilaterally.   No wheezes, crackles, or rhonchi.  Cardiovascular: Normal S1, S2. No MRG. Regular rate and rhythm. No peripheral edema, cyanosis or pallor.  Gastrointestinal:  Soft, nondistended, nontender. No rebound or guarding. Normal bowel sounds. No  appreciable masses or hepatomegaly. Rectal:  External: No hemorrhoids or lesions or tenderness; internal: Tenderness to palpation along superior aspect of rectum, no mass or internal hemorrhoids; Anoscopy: Fissure along superior aspect of rectum, 1 inch in length Psychiatric:  Demonstrates good judgement and reason without abnormal affect or behaviors.  No recent labs or imaging.  Assessment: 1. Anal fissure: Along superior aspect of rectum, likely occurred during childbirth, as this is when pain started, complicated by days of constipation 2. Rectal pain: Related to above  Plan: 1. Prescribed Nitroglycerin ointment 0.125 mg to be applied to the inside of the rectum with a gloved finger 3 times daily for 6-8 weeks. We did discuss that it can take even longer than this for fissures to heal. Patient verbalized understanding. 2. Recommend sitz baths 2-3 times a day. Discussed warm water baths and provided the patient with a handout. 3. Recommend patient avoid days of constipation with increase fiber intake, to at least 25-35 g per day. Patient should also increase her water intake to at least 6- 8 8 ounce glasses per day. 4. Patient to follow in clinic in 6-8 weeks, they requested to see a female doctor and are scheduled with Dr. Lavon PaganiniNandigam. If at that time there has been no improvement in symptoms, could consider a flex sigmoidoscopy versus colonoscopy versus continuing with a longer course of treatment.  Hyacinth MeekerJennifer Lemmon, PA-C [facility] 11/08/2016, 9:22 AM  Cc: Shade FloodGreene, Jeffrey R, MD

## 2016-11-09 NOTE — Progress Notes (Signed)
Reviewed and agree with documentation and assessment and plan. K. Veena Nandigam , MD   

## 2016-12-12 ENCOUNTER — Ambulatory Visit: Payer: BLUE CROSS/BLUE SHIELD | Admitting: Gastroenterology

## 2016-12-25 ENCOUNTER — Ambulatory Visit (INDEPENDENT_AMBULATORY_CARE_PROVIDER_SITE_OTHER): Payer: BLUE CROSS/BLUE SHIELD | Admitting: Gastroenterology

## 2016-12-25 ENCOUNTER — Encounter: Payer: Self-pay | Admitting: Gastroenterology

## 2016-12-25 VITALS — BP 90/60 | HR 92 | Ht 66.5 in | Wt 160.2 lb

## 2016-12-25 DIAGNOSIS — K602 Anal fissure, unspecified: Secondary | ICD-10-CM | POA: Diagnosis not present

## 2016-12-25 DIAGNOSIS — K5900 Constipation, unspecified: Secondary | ICD-10-CM

## 2016-12-25 MED ORDER — AMBULATORY NON FORMULARY MEDICATION: 0.1250 mg | Freq: Three times a day (TID) | 3 refills | Status: DC

## 2016-12-25 NOTE — Progress Notes (Signed)
Monique Patrick    [id number]    12-06-1985  Primary Care Physician:Stacy Hetty ElyJ Burns, MD  Referring Physician: Pincus SanesStacy J Burns, MD [address] [place], [place] [address]  Chief complaint: Anorectal disomfort HPI: [age] yr F here for follow up visit with c/o persistent anal discomfort. She saw Salome ArntJennifer Lemon on 11/08/16 and noted to have an anal fissure. She was advised to apply rectal Nitroglycerine three times daily. Patient said she did notice some improvement with the nitroglycerine, no bleeding. She is not using it consistently, she is concerned that she still has mild discomfort. Denies any rectal trauma. She is having daily bowel movements, occasionally has hard stool with excessive straining. Denies any nausea, vomiting, abdominal pain, melena or bright red blood per rectum   Outpatient Encounter Prescriptions as of 12/25/2016  Medication Sig  . AMBULATORY NON FORMULARY MEDICATION Place 0.125 mg rectally 3 (three) times daily. Medication Name: Nitroglycerin Ointment  . diphenhydrAMINE (BENADRYL) 25 mg capsule Take 1-2 capsules (25-50 mg total) by mouth every 6 (six) hours as needed. (Patient not taking: Reported on 11/08/2016)  . fexofenadine (ALLEGRA ALLERGY) 180 MG tablet Take 1 tablet (180 mg total) by mouth 2 (two) times daily as needed for allergies. (Patient not taking: Reported on 11/08/2016)  . neomycin-polymyxin-hydrocortisone (CORTISPORIN) otic solution Place 3 drops into the right ear 3 (three) times daily. (Patient not taking: Reported on 11/08/2016)  . pramoxine-hydrocortisone (PROCTOCREAM-HC) 1-1 % rectal cream PLACE 1 APPLICATION RECTALLY QID  . predniSONE (DELTASONE) 20 MG tablet Take 2 tablets (40 mg total) by mouth daily. Take 40 mg by mouth daily for 3 days, then 20mg  by mouth daily for 3 days, then 10mg  daily for 3 days (Patient not taking: Reported on 11/08/2016)  . predniSONE (DELTASONE) 20 MG tablet Take 3 tablets PO daily for 3 days, then continue previous  taper. (Patient not taking: Reported on 11/08/2016)   No facility-administered encounter medications on file as of 12/25/2016.     Allergies as of 12/25/2016  . (No Known Allergies)    Past Medical History:  Diagnosis Date  . Allergy     No past surgical history on file.  Family History  Problem Relation Age of Onset  . Diabetes Father   . Diabetes Maternal Grandmother   . Diabetes Paternal Grandfather     Social History   Social History  . Marital status: Married    Spouse name: N/A  . Number of children: 2  . Years of education: N/A   Occupational History  . Not on file.   Social History Main Topics  . Smoking status: Never Smoker  . Smokeless tobacco: Never Used  . Alcohol use No  . Drug use: No  . Sexual activity: No     Comment: desires Implanon   Other Topics Concern  . Not on file   Social History Narrative  . No narrative on file      Review of systems: Review of Systems  Constitutional: Negative for fever and chills.  HENT: Negative.   Eyes: Negative for blurred vision.  Respiratory: Negative for cough, shortness of breath and wheezing.   Cardiovascular: Negative for chest pain and palpitations.  Gastrointestinal: as per HPI Genitourinary: Negative for dysuria, urgency, frequency and hematuria.  Musculoskeletal: Negative for myalgias, back pain and joint pain.  Skin: Negative for itching and rash.  Neurological: Negative for dizziness, tremors, focal weakness, seizures and loss of consciousness.  Endo/Heme/Allergies: Positive for  seasonal allergies.  Psychiatric/Behavioral: Negative for depression, suicidal ideas and hallucinations.  All other systems reviewed and are negative.   Physical Exam: Vitals:   12/25/16 1412  BP: 90/60  Pulse: 92   Body mass index is 25.48 kg/m. Gen:      No acute distress HEENT:  EOMI, sclera anicteric Neck:     No masses; no thyromegaly Lungs:    Clear to auscultation bilaterally; normal respiratory  effort CV:         Regular rate and rhythm; no murmurs Abd:      + bowel sounds; soft, non-tender; no palpable masses, no distension Ext:    No edema; adequate peripheral perfusion Skin:      Warm and dry; no rash Neuro: alert and oriented x 3 Psych: normal mood and affect Rectal exam: not performed patient refused Data Reviewed:  Reviewed labs, radiology imaging, old records and pertinent past GI work up   Assessment and Plan/Recommendations:  59 yr F here for follow up visit with c/o anal discomfort Noted to have anal fissure on rectal exam November 08, 2016 She refused rectal exam today Explained to patient that she needs to apply rectal nitroglycerin 3-4 times daily for at least 2-3 months to help heal the anal fissure and prevent recurrent fissures Continue to increase dietary fiber, can use Benefiber 3 tablespoons 3 times with meals Increase fluid intake to 8-10 cups daily If continues to be symptomatic with no improvement in the next 2-3 months will consider colonoscopy for further evaluation and exclude inflammatory bowel disease  25 minutes was spent face-to-face with the patient. Greater than 50% of the time used for counseling as well as treatment plan and follow-up. She had multiple questions which were answered to her satisfaction  K. Scherry Ran , MD [phone] Mon-Fri 8a-5p [phone] after 5p, weekends, holidays  CC: Burns, Bobette Mo, MD

## 2016-12-25 NOTE — Patient Instructions (Signed)
If you are age 29 or older, your body mass index should be between 23-30. Your Body mass index is 25.48 kg/m. If this is out of the aforementioned range listed, please consider follow up with your Primary Care Provider.  If you are age 29 or younger, your body mass index should be between 19-25. Your Body mass index is 25.48 kg/m. If this is out of the aformentioned range listed, please consider follow up with your Primary Care Provider.   Take Benefiber 1 tablespoon three times daily  Drink water 8 oz. 8-10 cups daily  Follow up in three months

## 2017-01-02 ENCOUNTER — Ambulatory Visit (INDEPENDENT_AMBULATORY_CARE_PROVIDER_SITE_OTHER): Payer: BLUE CROSS/BLUE SHIELD | Admitting: Physician Assistant

## 2017-01-02 ENCOUNTER — Ambulatory Visit (INDEPENDENT_AMBULATORY_CARE_PROVIDER_SITE_OTHER): Payer: BLUE CROSS/BLUE SHIELD

## 2017-01-02 ENCOUNTER — Telehealth: Payer: Self-pay | Admitting: Physician Assistant

## 2017-01-02 ENCOUNTER — Encounter: Payer: Self-pay | Admitting: Physician Assistant

## 2017-01-02 VITALS — BP 107/65 | HR 76 | Temp 98.2°F | Resp 18 | Ht 66.5 in | Wt 128.0 lb

## 2017-01-02 DIAGNOSIS — R0989 Other specified symptoms and signs involving the circulatory and respiratory systems: Secondary | ICD-10-CM

## 2017-01-02 DIAGNOSIS — B9789 Other viral agents as the cause of diseases classified elsewhere: Secondary | ICD-10-CM | POA: Diagnosis not present

## 2017-01-02 DIAGNOSIS — F458 Other somatoform disorders: Secondary | ICD-10-CM

## 2017-01-02 DIAGNOSIS — R5383 Other fatigue: Secondary | ICD-10-CM

## 2017-01-02 DIAGNOSIS — Z23 Encounter for immunization: Secondary | ICD-10-CM

## 2017-01-02 DIAGNOSIS — R0789 Other chest pain: Secondary | ICD-10-CM

## 2017-01-02 DIAGNOSIS — J988 Other specified respiratory disorders: Secondary | ICD-10-CM

## 2017-01-02 LAB — D-DIMER, QUANTITATIVE (NOT AT ARMC)

## 2017-01-02 MED ORDER — OMEPRAZOLE 20 MG PO CPDR: 20.0000 mg | DELAYED_RELEASE_CAPSULE | Freq: Every day | ORAL | 3 refills | Status: DC

## 2017-01-02 MED ORDER — MELOXICAM 15 MG PO TABS: 15.0000 mg | ORAL_TABLET | Freq: Every day | ORAL | 0 refills | Status: DC

## 2017-01-02 MED ORDER — RANITIDINE HCL 150 MG PO TABS: 150.0000 mg | ORAL_TABLET | Freq: Two times a day (BID) | ORAL | 0 refills | Status: DC

## 2017-01-02 NOTE — Telephone Encounter (Signed)
Ddimer stat lab reported as normal by stat lab office

## 2017-01-02 NOTE — Patient Instructions (Addendum)
I will contact you with the results of your blood work.  If there is a positive d dimer, you will be contacted today, and you must go immediately to the emergency room.  They will need to confirm that there is not a clot.  This can be falsely positive, but you still must go.    I will contact you with the result of your chest xray today as well.  I am giving you medication for reflux.  If you continue to have this sensation after 3 weeks after taking medication and following a reflux reducing diet, contact me for referral to gastroenterologist.  IF you received an x-ray today, you will receive an invoice from [facility]. Please contact [facility] at [phone] with questions or concerns regarding your invoice.   IF you received labwork today, you will receive an invoice from [facility]. Please contact [facility] at [phone] with questions or concerns regarding your invoice.   Our billing staff will not be able to assist you with questions regarding bills from these companies.  You will be contacted with the lab results as soon as they are available. The fastest way to get your results is to activate your My Chart account. Instructions are located on the last page of this paperwork. If you have not heard from us regarding the results in 2 weeks, please contact this office.

## 2017-01-02 NOTE — Telephone Encounter (Signed)
Please advise patient that the ddimer was negative.   Xray has some possible bronchitis.  This is generally viral.  If you are not breast feeding, the I am giving you mobic which you will take once per day. It will help with chest wall inflammation and other inflammatory condition at this time.  If you are breast feeding, take tylenol only.   Please take the omeprazole with the mobic in the morning.   I also give you a medicine for the reflux again.  You will take this twice per day. I have sent a reflux diet regimen via your mychart.

## 2017-01-02 NOTE — Progress Notes (Signed)
[facility] [address], [place] [address] [phone]  Date:  01/02/2017   Name:  Monique Patrick   DOB:  1986/10/08   MRN:  [id number]  PCP:  Pincus Sanes, MD    History of Present Illness:  Monique Patrick is a 31 y.o. female patient who presents to [facility] for cc of congestion and a slightly productive cough.  She has subjective fever and chills.  She has chest tightness secondary to coughing.  she feels heavily fatigued.  There is nasal congestion.  No ear pain.  No sneezing or watery eyes.  She has used ibuprofen. She also complains of several weeks of the sensation that there is something stuck in her throat.  Possible sour taste.  Denies heart burn.  No nausea.  Sensation is not associated with food.  She has no hx of anemia.  Feels anxious about visiting her home country.  She states that she is ready to go.  No chest palpitations.  No hx of thyroid disease.   No caffeine intake.  Rarely has spicy foods, she reports. --no sob --no leg pain. --no ocp --no hx of dvt --no hx of malignancy.  Patient Active Problem List   Diagnosis Date Noted  . Hypotension 03/24/2015  . Urticaria 10/30/2012    Past Medical History:  Diagnosis Date  . Allergy     History reviewed. No pertinent surgical history.  Social History  Substance Use Topics  . Smoking status: Never Smoker  . Smokeless tobacco: Never Used  . Alcohol use No    Family History  Problem Relation Age of Onset  . Diabetes Father   . Diabetes Maternal Grandmother   . Diabetes Paternal Grandfather     No Known Allergies  Medication list has been reviewed and updated.  Current Outpatient Prescriptions on File Prior to Visit  Medication Sig Dispense Refill  . AMBULATORY NON FORMULARY MEDICATION Place 0.125 mg rectally 3 (three) times daily. Medication Name: Nitroglycerin Ointment 1 Tube 3  . pramoxine-hydrocortisone (PROCTOCREAM-HC) 1-1 % rectal cream PLACE 1 APPLICATION RECTALLY QID  0    No current facility-administered medications on file prior to visit.     ROS ROS otherwise unremarkable unless listed above.   Physical Examination: BP 107/65   Pulse 76   Temp 98.2 F (36.8 C) (Oral)   Resp 18   Ht 5' 6.5" (1.689 m)   Wt 128 lb (58.1 kg)   LMP 12/07/2016   SpO2 100%   BMI 20.35 kg/m  Ideal Body Weight: Weight in (lb) to have BMI = 25: 156.9  Physical Exam  Constitutional: She is oriented to person, place, and time. She appears well-developed and well-nourished. No distress.  HENT:  Head: Normocephalic and atraumatic.  Right Ear: Tympanic membrane, external ear and ear canal normal.  Left Ear: Tympanic membrane, external ear and ear canal normal.  Nose: Mucosal edema and rhinorrhea present. Right sinus exhibits no maxillary sinus tenderness and no frontal sinus tenderness. Left sinus exhibits no maxillary sinus tenderness and no frontal sinus tenderness.  Mouth/Throat: No uvula swelling. No oropharyngeal exudate, posterior oropharyngeal edema or posterior oropharyngeal erythema.  Eyes: Conjunctivae and EOM are normal. Pupils are equal, round, and reactive to light.  Neck: No thyroid mass and no thyromegaly present.  Cardiovascular: Normal rate and regular rhythm.  Exam reveals no gallop, no distant heart sounds and no friction rub.   No murmur heard. Pulses:      Carotid pulses are  2+ on the right side, and 2+ on the left side.      Radial pulses are 2+ on the right side, and 2+ on the left side.  Pulmonary/Chest: Effort normal. No respiratory distress. She has no decreased breath sounds. She has no wheezes. She has no rhonchi.  Lymphadenopathy:       Head (right side): No submandibular, no tonsillar, no preauricular and no posterior auricular adenopathy present.       Head (left side): No submandibular, no tonsillar, no preauricular and no posterior auricular adenopathy present.  Neurological: She is alert and oriented to person, place, and time.  Skin:  She is not diaphoretic.  Psychiatric: She has a normal mood and affect. Her behavior is normal.     Assessment and Plan: Monique Patrick is a 31 y.o. female who is here today  Likely cold virus with respiratory symptoms.   Globus sensation may be secondary to reflux.  I will start her on a PPI, and H2 blocker.  She is extremely concerned.  With somewhat of language barrier, will perform following labs.  If all normal.  Will proceed with gerd management, and follow up with gastroenterology if no improvement in 3 weeks. Other chest pain - Plan: DG Chest 2 View, D-dimer, quantitative ([facility])  Globus sensation - Plan: TSH, ranitidine (ZANTAC) 150 MG tablet, omeprazole (PRILOSEC) 20 MG capsule  Other fatigue - Plan: CBC  Need for prophylactic vaccination and inoculation against influenza - Plan: CANCELED: Flu Vaccine QUAD 36+ mos IM  Trena PlattStephanie English, PA-C [facility] 2/11/20186:13 PM

## 2017-01-03 LAB — CBC
Hematocrit: 37.8 % (ref 34.0–46.6)
Hemoglobin: 12.3 g/dL (ref 11.1–15.9)
MCH: 28.2 pg (ref 26.6–33.0)
MCHC: 32.5 g/dL (ref 31.5–35.7)
MCV: 87 fL (ref 79–97)
PLATELETS: 402 10*3/uL — AB (ref 150–379)
RBC: 4.36 x10E6/uL (ref 3.77–5.28)
RDW: 12.6 % (ref 12.3–15.4)
WBC: 5.4 10*3/uL (ref 3.4–10.8)

## 2017-01-03 LAB — TSH: TSH: 1.79 u[IU]/mL (ref 0.450–4.500)

## 2017-01-22 ENCOUNTER — Ambulatory Visit (INDEPENDENT_AMBULATORY_CARE_PROVIDER_SITE_OTHER): Payer: BLUE CROSS/BLUE SHIELD | Admitting: Family

## 2017-01-22 ENCOUNTER — Encounter: Payer: Self-pay | Admitting: Family

## 2017-01-22 ENCOUNTER — Ambulatory Visit (INDEPENDENT_AMBULATORY_CARE_PROVIDER_SITE_OTHER)
Admission: RE | Admit: 2017-01-22 | Discharge: 2017-01-22 | Disposition: A | Discharge status: Home / Self Care | Payer: BLUE CROSS/BLUE SHIELD | Source: Ambulatory Visit | Attending: Family | Admitting: Family

## 2017-01-22 VITALS — BP 112/70 | HR 72 | Temp 98.2°F | Resp 16 | Ht 66.5 in | Wt 156.0 lb

## 2017-01-22 DIAGNOSIS — K2100 Gastro-esophageal reflux disease with esophagitis, without bleeding: Secondary | ICD-10-CM

## 2017-01-22 DIAGNOSIS — G44209 Tension-type headache, unspecified, not intractable: Secondary | ICD-10-CM | POA: Insufficient documentation

## 2017-01-22 HISTORY — DX: Tension-type headache, unspecified, not intractable: G44.209

## 2017-01-22 HISTORY — DX: Gastro-esophageal reflux disease with esophagitis, without bleeding: K21.00

## 2017-01-22 MED ORDER — OMEPRAZOLE 20 MG PO CPDR: 20.0000 mg | DELAYED_RELEASE_CAPSULE | Freq: Every day | ORAL | 3 refills | Status: DC

## 2017-01-22 MED ORDER — OMEPRAZOLE 40 MG PO CPDR: 40.0000 mg | DELAYED_RELEASE_CAPSULE | Freq: Every day | ORAL | 3 refills | Status: DC

## 2017-01-22 NOTE — Assessment & Plan Note (Signed)
Symptoms and exam consistent with tension headaches given normal neurological exam. Treat conservatively with acetaminophen as needed. Would avoid ibuprofen/Aleve given current GERD issues. Denies worst headache of life or neck stiffness. May be related to decreased oral intake. Continue to monitor.

## 2017-01-22 NOTE — Progress Notes (Signed)
Subjective:    Patient ID: Monique Patrick, female    DOB: 01-04-1986, 31 y.o.   MRN: [id number]  Chief Complaint  Patient presents with  . Establish Care    headaches and loss of appetite due to heartburn    HPI:  Monique Patrick is a 31 y.o. female who  has a past medical history of Allergy; Chronic headaches; and GERD (gastroesophageal reflux disease). and presents today for na  1.)  Headaches - Associated symptom of headaches located around her head and described as squeezing. No sensitivity to light or sound without nausea or vomiting. Denies any modifying factors or attempted treatments. Denies worst headache of life or neck pain. No history of trauma or injury. No changes in vision.   2.) Heartburn - Previously diagnosed with GERD. Currently maintained on omeprazole and reports taking the medication as prescribed and continues to experience symptoms of fullness and burning. Aggravated by food and improved when her stomach is empty. Appetite is decreased. Does have the associated symptom of stomach pain located in the epigastric region. Denies nausea, vomiting, constipation or diarrhea.    No Known Allergies    Outpatient Medications Prior to Visit  Medication Sig Dispense Refill  . pramoxine-hydrocortisone (PROCTOCREAM-HC) 1-1 % rectal cream PLACE 1 APPLICATION RECTALLY QID  0  . ranitidine (ZANTAC) 150 MG tablet Take 1 tablet (150 mg total) by mouth 2 (two) times daily. 60 tablet 0  . AMBULATORY NON FORMULARY MEDICATION Place 0.125 mg rectally 3 (three) times daily. Medication Name: Nitroglycerin Ointment 1 Tube 3  . meloxicam (MOBIC) 15 MG tablet Take 1 tablet (15 mg total) by mouth daily. 14 tablet 0  . omeprazole (PRILOSEC) 20 MG capsule Take 1 capsule (20 mg total) by mouth daily. 30 capsule 3   No facility-administered medications prior to visit.      Past Medical History:  Diagnosis Date  . Allergy   . Chronic headaches   . GERD (gastroesophageal reflux disease)         History reviewed. No pertinent surgical history.    Family History  Problem Relation Age of Onset  . Diabetes Father   . Diabetes Maternal Grandmother   . Diabetes Paternal Grandfather       Social History   Social History  . Marital status: Married    Spouse name: N/A  . Number of children: 2  . Years of education: 12   Occupational History  . Not on file.   Social History Main Topics  . Smoking status: Never Smoker  . Smokeless tobacco: Never Used  . Alcohol use No  . Drug use: No  . Sexual activity: No     Comment: desires Implanon   Other Topics Concern  . Not on file   Social History Narrative  . No narrative on file      Review of Systems  Constitutional: Negative for chills and fever.  Respiratory: Negative for chest tightness and shortness of breath.   Cardiovascular: Negative for chest pain, palpitations and leg swelling.  Gastrointestinal: Positive for abdominal pain. Negative for anal bleeding, blood in stool, constipation, diarrhea, nausea, rectal pain and vomiting.  Neurological: Positive for headaches. Negative for dizziness, syncope and weakness.       Objective:    BP 112/70 (BP Location: Left Arm, Patient Position: Sitting, Cuff Size: Normal)   Pulse 72   Temp 98.2 F (36.8 C) (Oral)   Resp 16   Ht 5' 6.5" (1.689 m)   Wt 156  lb (70.8 kg)   LMP 12/30/2016   SpO2 97%   BMI 24.80 kg/m  Nursing note and vital signs reviewed.  Physical Exam  Constitutional: She is oriented to person, place, and time. She appears well-developed and well-nourished. No distress.  Eyes: Conjunctivae and EOM are normal. Pupils are equal, round, and reactive to light.  Cardiovascular: Normal rate, regular rhythm, normal heart sounds and intact distal pulses.   Pulmonary/Chest: Effort normal and breath sounds normal.  Abdominal: Normal appearance and bowel sounds are normal. She exhibits no mass. There is no hepatosplenomegaly. There is tenderness  in the epigastric area, suprapubic area and left lower quadrant. There is no rigidity, no rebound, no guarding, no tenderness at McBurney's point and negative Murphy's sign.  Neurological: She is alert and oriented to person, place, and time. No cranial nerve deficit.  Skin: Skin is warm and dry.  Psychiatric: She has a normal mood and affect. Her behavior is normal. Judgment and thought content normal.        Assessment & Plan:   Problem List Items Addressed This Visit      Digestive   Gastroesophageal reflux disease with esophagitis - Primary    Symptoms remain uncontrolled with previous omeprazole. Increase dosage. Information on GERD related diet provided. Continue Zantac as needed for uncontrolled symptoms. Does have mild lower abdominal pain with concern for constipation which may be a contributing factor. Obtain x-ray. Follow up if symptoms worsen or do not improve.       Relevant Medications   omeprazole (PRILOSEC) 40 MG capsule   Other Relevant Orders   DG Abd 2 Views (Completed)     Other   Tension headache    Symptoms and exam consistent with tension headaches given normal neurological exam. Treat conservatively with acetaminophen as needed. Would avoid ibuprofen/Aleve given current GERD issues. Denies worst headache of life or neck stiffness. May be related to decreased oral intake. Continue to monitor.           I have discontinued Ms. Quiggle's AMBULATORY NON FORMULARY MEDICATION, ranitidine, omeprazole, meloxicam, and omeprazole. I am also having her start on omeprazole. Additionally, I am having her maintain her pramoxine-hydrocortisone.   Meds ordered this encounter  Medications  . DISCONTD: omeprazole (PRILOSEC) 20 MG capsule    Sig: Take 1 capsule (20 mg total) by mouth daily.    Dispense:  30 capsule    Refill:  3    Order Specific Question:   Supervising Provider    Answer:   Hillard DankerRAWFORD, ELIZABETH A [[id number]]  . omeprazole (PRILOSEC) 40 MG capsule    Sig:  Take 1 capsule (40 mg total) by mouth daily.    Dispense:  30 capsule    Refill:  3    Order Specific Question:   Supervising Provider    Answer:   Hillard DankerRAWFORD, ELIZABETH A [[id number]]     Follow-up: Return in about 1 month (around 02/19/2017), or if symptoms worsen or fail to improve.  Jeanine Luzalone, Azile Minardi, FNP

## 2017-01-22 NOTE — Patient Instructions (Addendum)
Thank you for choosing [facility].  SUMMARY AND INSTRUCTIONS:   Medication:  Please continue to take your omeprazole. May take 2 of your current medication until completed and then with the new prescription take 1 tablet daily.  For headaches - start with 650 mg of Tylenol (acetaminophen) as needed up to 4 times per day. May use lesser dosage of necessary.  Your prescription(s) have been submitted to your pharmacy or been printed and provided for you. Please take as directed and contact our office if you believe you are having problem(s) with the medication(s) or have any questions.  Imaging / Radiology:  Please stop by radiology on the basement level of the building for your x-rays. Your results will be released to MyChart (or called to you) after review, usually within 72 hours after test completion. If any treatments or changes are necessary, you will be notified at that same time.  Follow up:  If your symptoms worsen or fail to improve, please contact our office for further instruction, or in case of emergency go directly to the emergency room at the closest medical facility.

## 2017-01-22 NOTE — Assessment & Plan Note (Addendum)
Symptoms remain uncontrolled with previous omeprazole. Increase dosage. Information on GERD related diet provided. Continue Zantac as needed for uncontrolled symptoms. Does have mild lower abdominal pain with concern for constipation which may be a contributing factor. Obtain x-ray. Follow up if symptoms worsen or do not improve.

## 2017-02-13 ENCOUNTER — Ambulatory Visit (INDEPENDENT_AMBULATORY_CARE_PROVIDER_SITE_OTHER): Payer: BLUE CROSS/BLUE SHIELD | Admitting: Nurse Practitioner

## 2017-02-13 ENCOUNTER — Encounter: Payer: Self-pay | Admitting: Nurse Practitioner

## 2017-02-13 VITALS — BP 120/78 | HR 68 | Temp 97.7°F | Ht 66.5 in | Wt 156.0 lb

## 2017-02-13 DIAGNOSIS — J0140 Acute pansinusitis, unspecified: Secondary | ICD-10-CM

## 2017-02-13 MED ORDER — OXYMETAZOLINE HCL 0.05 % NA SOLN: 1.0000 | Freq: Two times a day (BID) | NASAL | 0 refills | Status: DC

## 2017-02-13 MED ORDER — FLUTICASONE PROPIONATE 50 MCG/ACT NA SUSP: 2.0000 | Freq: Every day | NASAL | 0 refills | Status: DC

## 2017-02-13 MED ORDER — BENZONATATE 100 MG PO CAPS: 100.0000 mg | ORAL_CAPSULE | Freq: Three times a day (TID) | ORAL | 0 refills | Status: DC | PRN

## 2017-02-13 MED ORDER — DM-GUAIFENESIN ER 30-600 MG PO TB12: 1.0000 | ORAL_TABLET | Freq: Two times a day (BID) | ORAL | 0 refills | Status: DC | PRN

## 2017-02-13 MED ORDER — SALINE SPRAY 0.65 % NA SOLN: 1.0000 | NASAL | 0 refills | Status: DC | PRN

## 2017-02-13 MED ORDER — AMOXICILLIN-POT CLAVULANATE 875-125 MG PO TABS: 1.0000 | ORAL_TABLET | Freq: Two times a day (BID) | ORAL | 0 refills | Status: DC

## 2017-02-13 NOTE — Patient Instructions (Signed)
[facility] Instructions: Flonase and Afrin use: apply 1spray of afrin in each nare, wait 5mins, then apply 2sprays of flonase in each nare. Use both nasal spray consecutively x 3days, then flonase only for at least 14days.  Encourage adequate oral hydration.  Use over-the-counter  "cold" medicines  such as "Tylenol cold" , "Advil cold",  "Mucinex" or" Mucinex D"  for cough and congestion.  Avoid decongestants if you have high blood pressure. Use" Delsym" or" Robitussin" cough syrup varietis for cough.  You can use plain "Tylenol" or "Advi"l for fever, chills and achyness.   "Common cold" symptoms are usually triggered by a virus.  The antibiotics are usually not necessary. On average, a" viral cold" illness would take 4-7 days to resolve. Please, make an appointment if you are not better or if you're worse.  

## 2017-02-13 NOTE — Progress Notes (Signed)
Subjective:  Patient ID: Monique Patrick, female    DOB: 03-19-1986  Age: 31 y.o. MRN: [id number]  CC: Cough (coughing,alot of yellow mucus,congestion, pain on sinus area 2 wks. took otc)   Cough  This is a new problem. The current episode started 1 to 4 weeks ago. The problem has been gradually worsening. The problem occurs constantly. The cough is productive of purulent sputum. Associated symptoms include chills, ear congestion, headaches, nasal congestion and postnasal drip. Pertinent negatives include no chest pain, fever or rhinorrhea.    Outpatient Medications Prior to Visit  Medication Sig Dispense Refill  . omeprazole (PRILOSEC) 40 MG capsule Take 1 capsule (40 mg total) by mouth daily. 30 capsule 3  . pramoxine-hydrocortisone (PROCTOCREAM-HC) 1-1 % rectal cream PLACE 1 APPLICATION RECTALLY QID  0   No facility-administered medications prior to visit.     ROS See HPI  Objective:  BP 120/78   Pulse 68   Temp 97.7 F (36.5 C)   Ht 5' 6.5" (1.689 m)   Wt 156 lb (70.8 kg)   SpO2 98%   BMI 24.80 kg/m   BP Readings from Last 3 Encounters:  02/13/17 120/78  01/22/17 112/70  01/02/17 107/65    Wt Readings from Last 3 Encounters:  02/13/17 156 lb (70.8 kg)  01/22/17 156 lb (70.8 kg)  01/02/17 128 lb (58.1 kg)    Physical Exam  Constitutional: She is oriented to person, place, and time.  HENT:  Right Ear: External ear and ear canal normal. No mastoid tenderness. Tympanic membrane is not injected and not erythematous. A middle ear effusion is present.  Left Ear: External ear and ear canal normal. No mastoid tenderness. Tympanic membrane is not injected and not erythematous. A middle ear effusion is present.  Nose: Mucosal edema and rhinorrhea present. Right sinus exhibits maxillary sinus tenderness and frontal sinus tenderness. Left sinus exhibits maxillary sinus tenderness and frontal sinus tenderness.  Mouth/Throat: Uvula is midline. No trismus in the jaw. Posterior  oropharyngeal erythema present. No oropharyngeal exudate.  Eyes: No scleral icterus.  Neck: Normal range of motion. Neck supple.  Cardiovascular: Normal rate and normal heart sounds.   Pulmonary/Chest: Effort normal and breath sounds normal.  Musculoskeletal: She exhibits no edema.  Lymphadenopathy:    She has no cervical adenopathy.  Neurological: She is alert and oriented to person, place, and time.  Vitals reviewed.   Lab Results  Component Value Date   WBC 5.4 01/02/2017   HGB 12.1 (A) 07/23/2015   HCT 37.8 01/02/2017   PLT 402 (H) 01/02/2017   GLUCOSE 79 07/23/2015   ALT 39 (H) 07/23/2015   AST 32 (H) 07/23/2015   NA 137 07/23/2015   K 4.2 07/23/2015   CL 100 07/23/2015   CREATININE 0.61 07/23/2015   BUN 9 07/23/2015   CO2 26 07/23/2015   TSH 1.790 01/02/2017    Dg Abd 2 Views  Result Date: 01/22/2017 CLINICAL DATA:  Abdominal pain EXAM: ABDOMEN - 2 VIEW COMPARISON:  None. FINDINGS: Scattered large and small bowel gas is noted. No obstructive changes are seen. No free air is noted. No abnormal mass or abnormal calcifications are noted. No bony abnormality is seen. IMPRESSION: No acute abnormality noted. Electronically Signed   By: Alcide Clever M.D.   On: 01/22/2017 09:46    Assessment & Plan:   Monique Patrick was seen today for cough.  Diagnoses and all orders for this visit:  Acute non-recurrent pansinusitis -     amoxicillin-clavulanate (AUGMENTIN)  875-125 MG tablet; Take 1 tablet by mouth 2 (two) times daily. -     sodium chloride (OCEAN) 0.65 % SOLN nasal spray; Place 1 spray into both nostrils as needed for congestion. -     fluticasone (FLONASE) 50 MCG/ACT nasal spray; Place 2 sprays into both nostrils daily. -     oxymetazoline (AFRIN NASAL SPRAY) 0.05 % nasal spray; Place 1 spray into both nostrils 2 (two) times daily. Use only for 3days, then stop -     dextromethorphan-guaiFENesin (MUCINEX DM) 30-600 MG 12hr tablet; Take 1 tablet by mouth 2 (two) times daily as  needed for cough. -     benzonatate (TESSALON) 100 MG capsule; Take 1 capsule (100 mg total) by mouth 3 (three) times daily as needed for cough.   I am having Monique Patrick start on amoxicillin-clavulanate, sodium chloride, fluticasone, oxymetazoline, dextromethorphan-guaiFENesin, and benzonatate. I am also having her maintain her pramoxine-hydrocortisone and omeprazole.  Meds ordered this encounter  Medications  . amoxicillin-clavulanate (AUGMENTIN) 875-125 MG tablet    Sig: Take 1 tablet by mouth 2 (two) times daily.    Dispense:  14 tablet    Refill:  0    Order Specific Question:   Supervising Provider    Answer:   Tresa GarterPLOTNIKOV, ALEKSEI V [[id number]]  . sodium chloride (OCEAN) 0.65 % SOLN nasal spray    Sig: Place 1 spray into both nostrils as needed for congestion.    Dispense:  15 mL    Refill:  0    Order Specific Question:   Supervising Provider    Answer:   Tresa GarterPLOTNIKOV, ALEKSEI V [[id number]]  . fluticasone (FLONASE) 50 MCG/ACT nasal spray    Sig: Place 2 sprays into both nostrils daily.    Dispense:  16 g    Refill:  0    Order Specific Question:   Supervising Provider    Answer:   Tresa GarterPLOTNIKOV, ALEKSEI V [[id number]]  . oxymetazoline (AFRIN NASAL SPRAY) 0.05 % nasal spray    Sig: Place 1 spray into both nostrils 2 (two) times daily. Use only for 3days, then stop    Dispense:  30 mL    Refill:  0    Order Specific Question:   Supervising Provider    Answer:   Tresa GarterPLOTNIKOV, ALEKSEI V [[id number]]  . dextromethorphan-guaiFENesin (MUCINEX DM) 30-600 MG 12hr tablet    Sig: Take 1 tablet by mouth 2 (two) times daily as needed for cough.    Dispense:  14 tablet    Refill:  0    Order Specific Question:   Supervising Provider    Answer:   Tresa GarterPLOTNIKOV, ALEKSEI V [[id number]]  . benzonatate (TESSALON) 100 MG capsule    Sig: Take 1 capsule (100 mg total) by mouth 3 (three) times daily as needed for cough.    Dispense:  20 capsule    Refill:  0    Order Specific Question:   Supervising Provider    Answer:   Tresa GarterPLOTNIKOV,  ALEKSEI V [[id number]]    Follow-up: No Follow-up on file.  Alysia Pennaharlotte Tilford Deaton, NP

## 2017-02-13 NOTE — Progress Notes (Signed)
Pre visit review using our clinic review tool, if applicable. No additional management support is needed unless otherwise documented below in the visit note. 

## 2017-08-14 ENCOUNTER — Ambulatory Visit: Payer: BLUE CROSS/BLUE SHIELD | Admitting: Nurse Practitioner

## 2017-08-14 ENCOUNTER — Encounter: Payer: Self-pay | Admitting: Nurse Practitioner

## 2017-08-14 ENCOUNTER — Ambulatory Visit (INDEPENDENT_AMBULATORY_CARE_PROVIDER_SITE_OTHER): Payer: BLUE CROSS/BLUE SHIELD | Admitting: Nurse Practitioner

## 2017-08-14 VITALS — BP 108/74 | HR 79 | Temp 98.4°F | Ht 66.5 in | Wt 148.0 lb

## 2017-08-14 DIAGNOSIS — N926 Irregular menstruation, unspecified: Secondary | ICD-10-CM

## 2017-08-14 DIAGNOSIS — H538 Other visual disturbances: Secondary | ICD-10-CM

## 2017-08-14 DIAGNOSIS — R519 Headache, unspecified: Secondary | ICD-10-CM

## 2017-08-14 DIAGNOSIS — R51 Headache: Secondary | ICD-10-CM

## 2017-08-14 LAB — POCT URINE PREGNANCY: PREG TEST UR: NEGATIVE

## 2017-08-14 MED ORDER — CYCLOBENZAPRINE HCL 5 MG PO TABS: 5.0000 mg | ORAL_TABLET | Freq: Every day | ORAL | 0 refills | Status: DC

## 2017-08-14 MED ORDER — BUTALBITAL-APAP-CAFFEINE 50-325-40 MG PO TABS: 1.0000 | ORAL_TABLET | Freq: Four times a day (QID) | ORAL | 0 refills | Status: DC | PRN

## 2017-08-14 NOTE — Patient Instructions (Addendum)
You will be contacted to schedule head CT and appt with opthalmology.  Urine pregnancy test was negative.  Encourage adequate oral hydration.  Call office if no improvement in 5days.  General Headache Without Cause A headache is pain or discomfort felt around the head or neck area. The specific cause of a headache may not be found. There are many causes and types of headaches. A few common ones are:  Tension headaches.  Migraine headaches.  Cluster headaches.  Chronic daily headaches.  Follow these instructions at home: Watch your condition for any changes. Take these steps to help with your condition: Managing pain  Take over-the-counter and prescription medicines only as told by your health care provider.  Lie down in a dark, quiet room when you have a headache.  If directed, apply ice to the head and neck area: ? Put ice in a plastic bag. ? Place a towel between your skin and the bag. ? Leave the ice on for 20 minutes, 2-3 times per day.  Use a heating pad or hot shower to apply heat to the head and neck area as told by your health care provider.  Keep lights dim if bright lights bother you or make your headaches worse. Eating and drinking  Eat meals on a regular schedule.  Limit alcohol use.  Decrease the amount of caffeine you drink, or stop drinking caffeine. General instructions  Keep all follow-up visits as told by your health care provider. This is important.  Keep a headache journal to help find out what may trigger your headaches. For example, write down: ? What you eat and drink. ? How much sleep you get. ? Any change to your diet or medicines.  Try massage or other relaxation techniques.  Limit stress.  Sit up straight, and do not tense your muscles.  Do not use tobacco products, including cigarettes, chewing tobacco, or e-cigarettes. If you need help quitting, ask your health care provider.  Exercise regularly as told by your health care  provider.  Sleep on a regular schedule. Get 7-9 hours of sleep, or the amount recommended by your health care provider. Contact a health care provider if:  Your symptoms are not helped by medicine.  You have a headache that is different from the usual headache.  You have nausea or you vomit.  You have a fever. Get help right away if:  Your headache becomes severe.  You have repeated vomiting.  You have a stiff neck.  You have a loss of vision.  You have problems with speech.  You have pain in the eye or ear.  You have muscular weakness or loss of muscle control.  You lose your balance or have trouble walking.  You feel faint or pass out.  You have confusion. This information is not intended to replace advice given to you by your health care provider. Make sure you discuss any questions you have with your health care provider. Document Released: 11/13/2005 Document Revised: 04/20/2016 Document Reviewed: 03/08/2015 Elsevier Interactive Patient Education  2017 [employer].

## 2017-08-14 NOTE — Progress Notes (Signed)
Subjective:  Patient ID: Monique Patrick, female    DOB: Oct 06, 1986  Age: 31 y.o. MRN: [id number]  CC: Follow-up (head feels heavy,feels like something push against her temples/ blur vision--going on for 3 days. )   Headache   This is a recurrent problem. The current episode started more than 1 month ago. The problem occurs intermittently. The problem has been waxing and waning. The pain is located in the vertex and parietal region. The pain does not radiate. The pain quality is not similar to prior headaches. The quality of the pain is described as band-like, squeezing and throbbing. The pain is at a severity of 10/10. The pain is severe. Associated symptoms include anorexia, blurred vision, insomnia, photophobia, sinus pressure and a visual change. Pertinent negatives include no abdominal pain, abnormal behavior, back pain, coughing, dizziness, drainage, eye pain, eye watering, facial sweating, fever, hearing loss, loss of balance, muscle aches, nausea, neck pain, numbness, phonophobia, scalp tenderness, seizures, sore throat, swollen glands, tingling, tinnitus, vomiting, weakness or weight loss. The symptoms are aggravated by unknown. She has tried acetaminophen and NSAIDs for the symptoms. The treatment provided no relief. There is no history of hypertension, immunosuppression, migraine headaches, migraines in the family, obesity, recent head traumas or sinus disease.  denies any anxiety or depression.  Outpatient Medications Prior to Visit  Medication Sig Dispense Refill  . omeprazole (PRILOSEC) 40 MG capsule Take 1 capsule (40 mg total) by mouth daily. 30 capsule 3  . amoxicillin-clavulanate (AUGMENTIN) 875-125 MG tablet Take 1 tablet by mouth 2 (two) times daily. (Patient not taking: Reported on 08/14/2017) 14 tablet 0  . benzonatate (TESSALON) 100 MG capsule Take 1 capsule (100 mg total) by mouth 3 (three) times daily as needed for cough. (Patient not taking: Reported on 08/14/2017) 20 capsule 0   . dextromethorphan-guaiFENesin (MUCINEX DM) 30-600 MG 12hr tablet Take 1 tablet by mouth 2 (two) times daily as needed for cough. (Patient not taking: Reported on 08/14/2017) 14 tablet 0  . fluticasone (FLONASE) 50 MCG/ACT nasal spray Place 2 sprays into both nostrils daily. (Patient not taking: Reported on 08/14/2017) 16 g 0  . oxymetazoline (AFRIN NASAL SPRAY) 0.05 % nasal spray Place 1 spray into both nostrils 2 (two) times daily. Use only for 3days, then stop (Patient not taking: Reported on 08/14/2017) 30 mL 0  . pramoxine-hydrocortisone (PROCTOCREAM-HC) 1-1 % rectal cream PLACE 1 APPLICATION RECTALLY QID  0  . sodium chloride (OCEAN) 0.65 % SOLN nasal spray Place 1 spray into both nostrils as needed for congestion. (Patient not taking: Reported on 08/14/2017) 15 mL 0   No facility-administered medications prior to visit.     ROS See HPI  Objective:  BP 108/74   Pulse 79   Temp 98.4 F (36.9 C)   Ht 5' 6.5" (1.689 m)   Wt 148 lb (67.1 kg)   SpO2 100%   BMI 23.53 kg/m   BP Readings from Last 3 Encounters:  08/14/17 108/74  02/13/17 120/78  01/22/17 112/70    Wt Readings from Last 3 Encounters:  08/14/17 148 lb (67.1 kg)  02/13/17 156 lb (70.8 kg)  01/22/17 156 lb (70.8 kg)    Physical Exam  Constitutional: She is oriented to person, place, and time. No distress.  HENT:  Nose: Right sinus exhibits no maxillary sinus tenderness and no frontal sinus tenderness. Left sinus exhibits no maxillary sinus tenderness and no frontal sinus tenderness.  Neck: Normal range of motion. Neck supple. No thyromegaly present.  Cardiovascular:  Normal rate.   Pulmonary/Chest: Effort normal.  Abdominal: She exhibits no distension.  Musculoskeletal: Normal range of motion. She exhibits no edema.  Lymphadenopathy:    She has no cervical adenopathy.  Neurological: She is alert and oriented to person, place, and time. No cranial nerve deficit. Coordination normal.  Skin: Skin is warm and dry.    Psychiatric: She has a normal mood and affect. Her behavior is normal.  Vitals reviewed.   Lab Results  Component Value Date   WBC 5.4 01/02/2017   HGB 12.3 01/02/2017   HCT 37.8 01/02/2017   PLT 402 (H) 01/02/2017   GLUCOSE 79 07/23/2015   ALT 39 (H) 07/23/2015   AST 32 (H) 07/23/2015   NA 137 07/23/2015   K 4.2 07/23/2015   CL 100 07/23/2015   CREATININE 0.61 07/23/2015   BUN 9 07/23/2015   CO2 26 07/23/2015   TSH 1.790 01/02/2017    Dg Abd 2 Views  Result Date: 01/22/2017 CLINICAL DATA:  Abdominal pain EXAM: ABDOMEN - 2 VIEW COMPARISON:  None. FINDINGS: Scattered large and small bowel gas is noted. No obstructive changes are seen. No free air is noted. No abnormal mass or abnormal calcifications are noted. No bony abnormality is seen. IMPRESSION: No acute abnormality noted. Electronically Signed   By: Alcide Clever M.D.   On: 01/22/2017 09:46    Assessment & Plan:   Jalaysia was seen today for follow-up.  Diagnoses and all orders for this visit:  Chronic nonintractable headache, unspecified headache type -     CT Head Wo Contrast; Future -     Ambulatory referral to Ophthalmology -     butalbital-acetaminophen-caffeine (FIORICET, ESGIC) 50-325-40 MG tablet; Take 1 tablet by mouth every 6 (six) hours as needed for headache. -     cyclobenzaprine (FLEXERIL) 5 MG tablet; Take 1 tablet (5 mg total) by mouth at bedtime.  Blurry vision, bilateral -     Ambulatory referral to Ophthalmology  Irregular periods/menstrual cycles -     POCT urine pregnancy   I have discontinued Ms. Fadely's pramoxine-hydrocortisone, amoxicillin-clavulanate, sodium chloride, fluticasone, oxymetazoline, dextromethorphan-guaiFENesin, and benzonatate. I am also having her start on butalbital-acetaminophen-caffeine and cyclobenzaprine. Additionally, I am having her maintain her omeprazole.  Meds ordered this encounter  Medications  . butalbital-acetaminophen-caffeine (FIORICET, ESGIC) 50-325-40 MG  tablet    Sig: Take 1 tablet by mouth every 6 (six) hours as needed for headache.    Dispense:  20 tablet    Refill:  0    Order Specific Question:   Supervising Provider    Answer:   Tresa Garter [[id number]]  . cyclobenzaprine (FLEXERIL) 5 MG tablet    Sig: Take 1 tablet (5 mg total) by mouth at bedtime.    Dispense:  14 tablet    Refill:  0    Order Specific Question:   Supervising Provider    Answer:   Tresa Garter [[id number]]    Follow-up: Return if symptoms worsen or fail to improve.  Alysia Penna, NP

## 2017-08-17 ENCOUNTER — Ambulatory Visit (INDEPENDENT_AMBULATORY_CARE_PROVIDER_SITE_OTHER)
Admission: RE | Admit: 2017-08-17 | Discharge: 2017-08-17 | Disposition: A | Discharge status: Home / Self Care | Payer: BLUE CROSS/BLUE SHIELD | Source: Ambulatory Visit | Attending: Nurse Practitioner | Admitting: Nurse Practitioner

## 2017-08-17 DIAGNOSIS — R51 Headache: Secondary | ICD-10-CM | POA: Diagnosis not present

## 2017-08-17 DIAGNOSIS — G8929 Other chronic pain: Secondary | ICD-10-CM

## 2017-08-20 ENCOUNTER — Telehealth: Payer: Self-pay | Admitting: Family

## 2017-08-20 NOTE — Telephone Encounter (Signed)
Pt husband called in and said that headache treatment is not working and would like someone to call him back and see what needs to happen next?

## 2017-08-20 NOTE — Telephone Encounter (Signed)
Spoke with pt, advise to use flexeril at night when she has headache and fioricet every 6 house as needed for headache (she can use both). Report of any side effects she might have to [place]. Pt verbalized understand.

## 2017-08-22 ENCOUNTER — Telehealth: Payer: Self-pay | Admitting: Internal Medicine

## 2017-08-22 NOTE — Telephone Encounter (Signed)
Patient Monique Patrick never seen [facility] - PCP Veryl Speak, FNP - says she has headache and has allergy . Wants to know if ok to take allegra with her pain medications. I told her 3 times that I cannot answer or help her with this answer. Repeatedly told her to call PCP practice on call. She sounded upset about this  Dr. Kalman Shan, M.D., [facility].C.P Pulmonary and Critical Care Medicine Staff Physician [facility] Pager: [phone], If no answer or between  15:00h - 7:00h: call [phone]  08/22/2017 8:53 PM

## 2017-08-23 ENCOUNTER — Telehealth: Payer: Self-pay | Admitting: Family

## 2017-08-23 NOTE — Telephone Encounter (Signed)
Ok to take, however would not recommend taking all at the same time.

## 2017-08-23 NOTE — Telephone Encounter (Signed)
Pt called asking if it is ok to take  butalbital-acetaminophen-caffeine (FIORICET, ESGIC) 50-325-40 MG tablet And  cyclobenzaprine (FLEXERIL) 5 MG tablet With allegra.  Please refer to phone note from yesterday 9/26 from [facility] office.  Please call back

## 2017-08-24 NOTE — Telephone Encounter (Signed)
Returned pts call. LVM asking her to call back for response below.

## 2017-08-24 NOTE — Telephone Encounter (Signed)
Pt husband infomed of response

## 2017-10-17 ENCOUNTER — Encounter: Payer: Self-pay | Admitting: Family

## 2017-11-27 NOTE — L&D Delivery Note (Signed)
Patient: Monique Patrick MRN: [id number]  GBS status: Negative, IAP given: None   Patient is a 32 y.o. now G3P3 s/p NSVD at [redacted]w[redacted]d, who was admitted for SOL. AROM 0h 60m prior to delivery with clear fluid.    Delivery Note At 6:47 AM a viable female was delivered via Vaginal, Spontaneous (Presentation: OA).  APGAR: 9, 9; weight pending .   Placenta status: spontaneous, intact.  Cord: 3 vessel with the following complications: none.    Anesthesia:  Epidural  Episiotomy: None Lacerations: 2nd degree;Perineal Suture Repair: 3.0 vicryl rapide Est. Blood Loss (mL):  250  Head delivered OA. No nuchal cord present. Shoulder and body delivered in usual fashion. Infant with spontaneous cry, placed on mother's abdomen, dried and bulb suctioned. Cord clamped x 2 after 1-minute delay, and cut by family member. Cord blood drawn. Placenta delivered spontaneously with gentle cord traction. Fundus firm with massage and Pitocin. Perineum inspected and found to have 2nd degree laceration, which was repaired with 3.0 vicryl rapide with good hemostasis achieved.  Mom to postpartum.  Baby to Couplet care / Skin to Skin.  De Hollingshead 08/25/2018, 7:13 AM

## 2018-01-11 ENCOUNTER — Ambulatory Visit: Payer: BLUE CROSS/BLUE SHIELD | Admitting: Nurse Practitioner

## 2018-01-14 ENCOUNTER — Ambulatory Visit (INDEPENDENT_AMBULATORY_CARE_PROVIDER_SITE_OTHER): Payer: 59 | Admitting: Nurse Practitioner

## 2018-01-14 ENCOUNTER — Encounter: Payer: Self-pay | Admitting: Nurse Practitioner

## 2018-01-14 VITALS — BP 112/82 | HR 80 | Temp 98.1°F | Ht 66.5 in | Wt 155.0 lb

## 2018-01-14 DIAGNOSIS — Z3201 Encounter for pregnancy test, result positive: Secondary | ICD-10-CM | POA: Diagnosis not present

## 2018-01-14 NOTE — Progress Notes (Signed)
Subjective:  Patient ID: Monique Patrick, female    DOB: 1986-01-29  Age: 32 y.o. MRN: [id number]  CC: Follow-up (home pregnancy test positive/2 wk ago/wants to confirm the test. )   HPI  Monique Patrick is accompanied by husband, daughter and interpreter. She reports that she had positive urine pregnancy at home 2weeks ago. LMP 11/24/2017. She does not have OB, hence will like to confirm pregnancy. Today she complain of intermittent ABD discomfort. Denies any dysuria or vaginal bleeding or vaginal discharge or nausea or vomiting or fatigue.  Outpatient Medications Prior to Visit  Medication Sig Dispense Refill  . omeprazole (PRILOSEC) 40 MG capsule Take 1 capsule (40 mg total) by mouth daily. 30 capsule 3  . butalbital-acetaminophen-caffeine (FIORICET, ESGIC) 50-325-40 MG tablet Take 1 tablet by mouth every 6 (six) hours as needed for headache. (Patient not taking: Reported on 01/14/2018) 20 tablet 0  . cyclobenzaprine (FLEXERIL) 5 MG tablet Take 1 tablet (5 mg total) by mouth at bedtime. (Patient not taking: Reported on 01/14/2018) 14 tablet 0   No facility-administered medications prior to visit.     ROS See HPI  Objective:  BP 112/82   Pulse 80   Temp 98.1 F (36.7 C)   Ht 5' 6.5" (1.689 m)   Wt 155 lb (70.3 kg)   SpO2 99%   BMI 24.64 kg/m   BP Readings from Last 3 Encounters:  01/14/18 112/82  08/14/17 108/74  02/13/17 120/78    Wt Readings from Last 3 Encounters:  01/14/18 155 lb (70.3 kg)  08/14/17 148 lb (67.1 kg)  02/13/17 156 lb (70.8 kg)    Physical Exam  Constitutional: She is oriented to person, place, and time. No distress.  Neck: No thyromegaly present.  Cardiovascular: Normal rate.  Pulmonary/Chest: Effort normal.  Abdominal: Soft. Bowel sounds are normal. She exhibits no distension. There is no tenderness.  Neurological: She is alert and oriented to person, place, and time.  Skin: Skin is warm and dry.  Psychiatric: She has a normal mood and  affect. Her behavior is normal.  Vitals reviewed.   Lab Results  Component Value Date   WBC 5.4 01/02/2017   HGB 12.3 01/02/2017   HCT 37.8 01/02/2017   PLT 402 (H) 01/02/2017   GLUCOSE 79 07/23/2015   ALT 39 (H) 07/23/2015   AST 32 (H) 07/23/2015   NA 137 07/23/2015   K 4.2 07/23/2015   CL 100 07/23/2015   CREATININE 0.61 07/23/2015   BUN 9 07/23/2015   CO2 26 07/23/2015   TSH 1.790 01/02/2017    Ct Head Wo Contrast  Result Date: 08/17/2017 CLINICAL DATA:  Headaches EXAM: CT HEAD WITHOUT CONTRAST TECHNIQUE: Contiguous axial images were obtained from the base of the skull through the vertex without intravenous contrast. COMPARISON:  None. FINDINGS: Brain: No evidence of acute infarction, hemorrhage, hydrocephalus, extra-axial collection or mass lesion/mass effect. Vascular: No hyperdense vessel or unexpected calcification. Skull: Normal. Negative for fracture or focal lesion. Sinuses/Orbits: No acute finding. Other: None. IMPRESSION: No acute abnormality noted. Electronically Signed   By: Alcide CleverMark  Lukens M.D.   On: 08/17/2017 13:48    Assessment & Plan:   Monique Patrick was seen today for follow-up.  Diagnoses and all orders for this visit:  Positive urine pregnancy test -     hCG, serum, qualitative   I have discontinued Monique Patrick's butalbital-acetaminophen-caffeine and cyclobenzaprine. I am also having her maintain her omeprazole.  No orders of the defined types were placed in this encounter.  Follow-up: Return if symptoms worsen or fail to improve.  Wilfred Lacy, NP

## 2018-01-15 ENCOUNTER — Encounter: Payer: Self-pay | Admitting: Nurse Practitioner

## 2018-01-15 LAB — HCG, SERUM, QUALITATIVE: PREG SERUM: POSITIVE — AB

## 2018-01-15 NOTE — Patient Instructions (Signed)
Positive serum pregnancy. Make appt with OB ASAP. Start prenatal multivitamin.

## 2018-02-13 ENCOUNTER — Encounter: Payer: Self-pay | Admitting: Advanced Practice Midwife

## 2018-02-13 ENCOUNTER — Ambulatory Visit (INDEPENDENT_AMBULATORY_CARE_PROVIDER_SITE_OTHER): Payer: 59 | Admitting: Advanced Practice Midwife

## 2018-02-13 VITALS — BP 104/53 | HR 81 | Wt 157.0 lb

## 2018-02-13 DIAGNOSIS — Z113 Encounter for screening for infections with a predominantly sexual mode of transmission: Secondary | ICD-10-CM

## 2018-02-13 DIAGNOSIS — Z3491 Encounter for supervision of normal pregnancy, unspecified, first trimester: Secondary | ICD-10-CM

## 2018-02-13 DIAGNOSIS — Z3490 Encounter for supervision of normal pregnancy, unspecified, unspecified trimester: Secondary | ICD-10-CM | POA: Insufficient documentation

## 2018-02-13 DIAGNOSIS — Z124 Encounter for screening for malignant neoplasm of cervix: Secondary | ICD-10-CM

## 2018-02-13 DIAGNOSIS — Z1151 Encounter for screening for human papillomavirus (HPV): Secondary | ICD-10-CM

## 2018-02-13 LAB — POCT URINALYSIS DIP (DEVICE)
Bilirubin Urine: NEGATIVE
GLUCOSE, UA: NEGATIVE mg/dL
Hgb urine dipstick: NEGATIVE
Ketones, ur: NEGATIVE mg/dL
LEUKOCYTES UA: NEGATIVE
Nitrite: NEGATIVE
PROTEIN: 30 mg/dL — AB
SPECIFIC GRAVITY, URINE: 1.025 (ref 1.005–1.030)
UROBILINOGEN UA: 0.2 mg/dL (ref 0.0–1.0)
pH: 6 (ref 5.0–8.0)

## 2018-02-13 MED ORDER — PRENATAL PLUS 27-1 MG PO TABS: 1.0000 | ORAL_TABLET | Freq: Every day | ORAL | 12 refills | Status: DC

## 2018-02-13 NOTE — Progress Notes (Signed)
  Subjective:    Monique Patrick is being seen today for her first obstetrical visit.  This is a planned pregnancy. She is at 353w5d gestation. Her obstetrical history is significant for nothing. Relationship with FOB: spouse, living together. Patient does intend to breast feed. Pregnancy history fully reviewed.  Patient reports no complaints.  Live Arabic interpreted used.   Review of Systems:   Review of Systems  Constitutional: Negative for fever.  Gastrointestinal: Negative for abdominal pain, constipation, diarrhea, nausea and vomiting.  Genitourinary: Negative for dysuria, vaginal bleeding and vaginal discharge.  Neurological: Negative for headaches.    Objective:     BP (!) 104/53   Pulse 81   Wt 157 lb (71.2 kg)   LMP 11/23/2017   BMI 24.96 kg/m  Physical Exam  Nursing note and vitals reviewed. Constitutional: She is oriented to person, place, and time. She appears well-developed and well-nourished. No distress.  HENT:  Head: Normocephalic and atraumatic.  Eyes: No scleral icterus.  Cardiovascular: Normal rate, regular rhythm and normal heart sounds.  Respiratory: Effort normal and breath sounds normal. No respiratory distress.  GI: Soft. There is no tenderness.  Uterus non-palpable.   Genitourinary:  Genitourinary Comments: Deferred due to long visit.  Musculoskeletal: She exhibits no edema or tenderness.  Neurological: She is alert and oriented to person, place, and time. She has normal reflexes.  Skin: Skin is warm and dry.  Psychiatric: She has a normal mood and affect.      Fetal Exam Fetal Monitor Review: Mode: hand-held doppler probe.   Baseline rate: 169.         Assessment:    Pregnancy: [id number] Patient Active Problem List   Diagnosis Date Noted  . Encounter for supervision of low-risk pregnancy 02/13/2018  . Tension headache 01/22/2017  . Gastroesophageal reflux disease with esophagitis 01/22/2017  . Hypotension 03/24/2015  . Urticaria  10/30/2012       Plan:  1. Encounter for supervision of low-risk pregnancy, antepartum  - prenatal vitamin w/FE, FA (PRENATAL 1 + 1) 27-1 MG TABS tablet; Take 1 tablet by mouth daily at 12 noon.  Dispense: 30 each; Refill: 12 - Culture, OB Urine - Cytology - PAP - Obstetric Panel, Including HIV - SMN1 COPY NUMBER ANALYSIS (SMA Carrier Screen) - US MFM OB COMP + 14 WK; Future - Glucose tolerance, 1 hour    Initial labs drawn. Prenatal vitamins. Problem list reviewed and updated. AFP3 discussed: declined. Role of ultrasound in pregnancy discussed; fetal survey: ordered. Amniocentesis discussed: not indicated. [facility] weeks[facility]   [facility] Christ[facility] Reger 02/13/2018

## 2018-02-13 NOTE — Patient Instructions (Addendum)
First Trimester of Pregnancy The first trimester of pregnancy is from week 1 until the end of week 13 (months 1 through 3). A week after a sperm fertilizes an egg, the egg will implant on the wall of the uterus. This embryo will begin to develop into a baby. Genes from you and your partner will form the baby. The female genes will determine whether the baby will be a boy or a girl. At 6-8 weeks, the eyes and face will be formed, and the heartbeat can be seen on ultrasound. At the end of 12 weeks, all the baby's organs will be formed. Now that you are pregnant, you will want to do everything you can to have a healthy baby. Two of the most important things are to get good prenatal care and to follow your health care provider's instructions. Prenatal care is all the medical care you receive before the baby's birth. This care will help prevent, find, and treat any problems during the pregnancy and childbirth. Body changes during your first trimester Your body goes through many changes during pregnancy. The changes vary from woman to woman.  You may gain or lose a couple of pounds at first.  You may feel sick to your stomach (nauseous) and you may throw up (vomit). If the vomiting is uncontrollable, call your health care provider.  You may tire easily.  You may develop headaches that can be relieved by medicines. All medicines should be approved by your health care provider.  You may urinate more often. Painful urination may mean you have a bladder infection.  You may develop heartburn as a result of your pregnancy.  You may develop constipation because certain hormones are causing the muscles that push stool through your intestines to slow down.  You may develop hemorrhoids or swollen veins (varicose veins).  Your breasts may begin to grow larger and become tender. Your nipples may stick out more, and the tissue that surrounds them (areola) may become darker.  Your gums may bleed and may be  sensitive to brushing and flossing.  Dark spots or blotches (chloasma, mask of pregnancy) may develop on your face. This will likely fade after the baby is born.  Your menstrual periods will stop.  You may have a loss of appetite.  You may develop cravings for certain kinds of food.  You may have changes in your emotions from day to day, such as being excited to be pregnant or being concerned that something may go wrong with the pregnancy and baby.  You may have more vivid and strange dreams.  You may have changes in your hair. These can include thickening of your hair, rapid growth, and changes in texture. Some women also have hair loss during or after pregnancy, or hair that feels dry or thin. Your hair will most likely return to normal after your baby is born.  What to expect at prenatal visits During a routine prenatal visit:  You will be weighed to make sure you and the baby are growing normally.  Your blood pressure will be taken.  Your abdomen will be measured to track your baby's growth.  The fetal heartbeat will be listened to between weeks 10 and 14 of your pregnancy.  Test results from any previous visits will be discussed.  Your health care provider may ask you:  How you are feeling.  If you are feeling the baby move.  If you have had any abnormal symptoms, such as leaking fluid, bleeding, severe headaches,   or abdominal cramping.  If you are using any tobacco products, including cigarettes, chewing tobacco, and electronic cigarettes.  If you have any questions.  Other tests that may be performed during your first trimester include:  Blood tests to find your blood type and to check for the presence of any previous infections. The tests will also be used to check for low iron levels (anemia) and protein on red blood cells (Rh antibodies). Depending on your risk factors, or if you previously had diabetes during pregnancy, you may have tests to check for high blood  sugar that affects pregnant women (gestational diabetes).  Urine tests to check for infections, diabetes, or protein in the urine.  An ultrasound to confirm the proper growth and development of the baby.  Fetal screens for spinal cord problems (spina bifida) and Down syndrome.  HIV (human immunodeficiency virus) testing. Routine prenatal testing includes screening for HIV, unless you choose not to have this test.  You may need other tests to make sure you and the baby are doing well.  Follow these instructions at home: Medicines  Follow your health care provider's instructions regarding medicine use. Specific medicines may be either safe or unsafe to take during pregnancy.  Take a prenatal vitamin that contains at least 600 micrograms (mcg) of folic acid.  If you develop constipation, try taking a stool softener if your health care provider approves. Eating and drinking  Eat a balanced diet that includes fresh fruits and vegetables, whole grains, good sources of protein such as meat, eggs, or tofu, and low-fat dairy. Your health care provider will help you determine the amount of weight gain that is right for you.  Avoid raw meat and uncooked cheese. These carry germs that can cause birth defects in the baby.  Eating four or five small meals rather than three large meals a day may help relieve nausea and vomiting. If you start to feel nauseous, eating a few soda crackers can be helpful. Drinking liquids between meals, instead of during meals, also seems to help ease nausea and vomiting.  Limit foods that are high in fat and processed sugars, such as fried and sweet foods.  To prevent constipation: ? Eat foods that are high in fiber, such as fresh fruits and vegetables, whole grains, and beans. ? Drink enough fluid to keep your urine clear or pale yellow. Activity  Exercise only as directed by your health care provider. Most women can continue their usual exercise routine during  pregnancy. Try to exercise for 30 minutes at least 5 days a week. Exercising will help you: ? Control your weight. ? Stay in shape. ? Be prepared for labor and delivery.  Experiencing pain or cramping in the lower abdomen or lower back is a good sign that you should stop exercising. Check with your health care provider before continuing with normal exercises.  Try to avoid standing for long periods of time. Move your legs often if you must stand in one place for a long time.  Avoid heavy lifting.  Wear low-heeled shoes and practice good posture.  You may continue to have sex unless your health care provider tells you not to. Relieving pain and discomfort  Wear a good support bra to relieve breast tenderness.  Take warm sitz baths to soothe any pain or discomfort caused by hemorrhoids. Use hemorrhoid cream if your health care provider approves.  Rest with your legs elevated if you have leg cramps or low back pain.  If you develop   varicose veins in your legs, wear support hose. Elevate your feet for 15 minutes, 3-4 times a day. Limit salt in your diet. Prenatal care  Schedule your prenatal visits by the twelfth week of pregnancy. They are usually scheduled monthly at first, then more often in the last 2 months before delivery.  Write down your questions. Take them to your prenatal visits.  Keep all your prenatal visits as told by your health care provider. This is important. Safety  Wear your seat belt at all times when driving.  Make a list of emergency phone numbers, including numbers for family, friends, the hospital, and police and fire departments. General instructions  Ask your health care provider for a referral to a local prenatal education class. Begin classes no later than the beginning of month 6 of your pregnancy.  Ask for help if you have counseling or nutritional needs during pregnancy. Your health care provider can offer advice or refer you to specialists for help  with various needs.  Do not use hot tubs, steam rooms, or saunas.  Do not douche or use tampons or scented sanitary pads.  Do not cross your legs for long periods of time.  Avoid cat litter boxes and soil used by cats. These carry germs that can cause birth defects in the baby and possibly loss of the fetus by miscarriage or stillbirth.  Avoid all smoking, herbs, alcohol, and medicines not prescribed by your health care provider. Chemicals in these products affect the formation and growth of the baby.  Do not use any products that contain nicotine or tobacco, such as cigarettes and e-cigarettes. If you need help quitting, ask your health care provider. You may receive counseling support and other resources to help you quit.  Schedule a dentist appointment. At home, brush your teeth with a soft toothbrush and be gentle when you floss. Contact a health care provider if:  You have dizziness.  You have mild pelvic cramps, pelvic pressure, or nagging pain in the abdominal area.  You have persistent nausea, vomiting, or diarrhea.  You have a bad smelling vaginal discharge.  You have pain when you urinate.  You notice increased swelling in your face, hands, legs, or ankles.  You are exposed to fifth disease or chickenpox.  You are exposed to German measles (rubella) and have never had it. Get help right away if:  You have a fever.  You are leaking fluid from your vagina.  You have spotting or bleeding from your vagina.  You have severe abdominal cramping or pain.  You have rapid weight gain or loss.  You vomit blood or material that looks like coffee grounds.  You develop a severe headache.  You have shortness of breath.  You have any kind of trauma, such as from a fall or a car accident. Summary  The first trimester of pregnancy is from week 1 until the end of week 13 (months 1 through 3).  Your body goes through many changes during pregnancy. The changes vary from  woman to woman.  You will have routine prenatal visits. During those visits, your health care provider will examine you, discuss any test results you may have, and talk with you about how you are feeling. This information is not intended to replace advice given to you by your health care provider. Make sure you discuss any questions you have with your health care provider. Document Released: 11/07/2001 Document Revised: 10/25/2016 Document Reviewed: 10/25/2016 Elsevier Interactive Patient Education  2018 Elsevier   Inc.   Pregnancy and Influenza Influenza, also called the flu, is an infection of the respiratory tract. If you are pregnant, you are more likely to catch the flu. You are also more likely to have a more serious case of the flu. This is because pregnancy lowers your body's ability to fight off infections (it weakens your immune system). It also puts additional stress on your heart and lungs, which makes you more likely to have complications. Having a bad case of the flu, especially with a high fever, can be dangerous for your developing baby. It can cause you to go into early labor. How do people get the flu? The flu is caused by the influenza virus. This virus is common every year in the fall and winter. It spreads when virus particles get passed from person to person. You can get the virus if you are near a sick person who is coughing or sneezing. You can also get the virus if you touch something that has the virus on it and then touch your face. How can I protect myself against the flu?  Get a flu shot. The best way to prevent the flu is to get a flu shot before flu season starts. The flu shot is not dangerous for your developing baby. It may even help protect your baby from the flu for up to 6 months after birth. The flu shot is one type of flu vaccine. Another type is a nasal spray vaccine. Do not get the nasal spray vaccine. It is not approved for pregnancy.  Do not come in close  contact with sick people.  Do not share food, drinks, or utensils with other people.  Wash your hands often. Use hand sanitizer when soap and water are not available. What should I do if I have flu symptoms? If you have any flu symptoms, call your health care provider right away. Flu symptoms include:  Fever or chills.  Muscle aches.  Headache.  Sore throat.  Nasal congestion.  Cough.  Feeling tired.  Loss of appetite.  Vomiting.  Diarrhea.  You may be able to take an antiviral medicine to keep the flu from becoming severe and to shorten how long it lasts. What should I do at home if I am diagnosed with the flu?  Do not take any medicine, including cold or flu medicine, unless directed by your health care provider.  If you take antiviral medicine, make sure you finish it even if you start to feel better.  Drink enough fluid to keep your urine clear or pale yellow.  Get plenty of rest. When would I seek immediate medical care if I have the flu?  You have trouble breathing.  You have chest pain.  You begin to have labor pains.  You have a high fever that does not go down after you take medicine.  You do not feel your baby move.  You have diarrhea or vomiting that will not go away. This information is not intended to replace advice given to you by your health care provider. Make sure you discuss any questions you have with your health care provider. Document Released: 09/15/2008 Document Revised: 04/20/2016 Document Reviewed: 10/10/2013 Elsevier Interactive Patient Education  2017 [employer]

## 2018-02-13 NOTE — Progress Notes (Signed)
Patient is here for new OB visit. Given new pregnancy pack. Not elligible for baby scrip.

## 2018-02-14 LAB — CYTOLOGY - PAP
Chlamydia: NEGATIVE
Diagnosis: NEGATIVE
HPV (WINDOPATH): NOT DETECTED
Neisseria Gonorrhea: NEGATIVE

## 2018-02-15 ENCOUNTER — Encounter: Payer: Self-pay | Admitting: Advanced Practice Midwife

## 2018-02-15 LAB — URINE CULTURE, OB REFLEX: Organism ID, Bacteria: NO GROWTH

## 2018-02-15 LAB — CULTURE, OB URINE

## 2018-02-20 LAB — OBSTETRIC PANEL, INCLUDING HIV
Antibody Screen: NEGATIVE
BASOS: 1 %
Basophils Absolute: 0 10*3/uL (ref 0.0–0.2)
EOS (ABSOLUTE): 0.1 10*3/uL (ref 0.0–0.4)
EOS: 1 %
HEMATOCRIT: 35.2 % (ref 34.0–46.6)
HEMOGLOBIN: 11.5 g/dL (ref 11.1–15.9)
HEP B S AG: NEGATIVE
HIV Screen 4th Generation wRfx: NONREACTIVE
IMMATURE GRANULOCYTES: 1 %
Immature Grans (Abs): 0 10*3/uL (ref 0.0–0.1)
LYMPHS ABS: 2.1 10*3/uL (ref 0.7–3.1)
Lymphs: 36 %
MCH: 29.3 pg (ref 26.6–33.0)
MCHC: 32.7 g/dL (ref 31.5–35.7)
MCV: 90 fL (ref 79–97)
Monocytes Absolute: 0.4 10*3/uL (ref 0.1–0.9)
Monocytes: 7 %
NEUTROS PCT: 54 %
Neutrophils Absolute: 3.2 10*3/uL (ref 1.4–7.0)
Platelets: 318 10*3/uL (ref 150–379)
RBC: 3.93 x10E6/uL (ref 3.77–5.28)
RDW: 13.3 % (ref 12.3–15.4)
RH TYPE: POSITIVE
RPR: NONREACTIVE
Rubella Antibodies, IGG: 26.5 index (ref >0.99)
WBC: 5.8 10*3/uL (ref 3.4–10.8)

## 2018-02-20 LAB — SMN1 COPY NUMBER ANALYSIS (SMA CARRIER SCREENING)

## 2018-02-20 LAB — GLUCOSE TOLERANCE, 1 HOUR: GLUCOSE, 1HR PP: 107 mg/dL (ref 65–199)

## 2018-03-13 ENCOUNTER — Ambulatory Visit (INDEPENDENT_AMBULATORY_CARE_PROVIDER_SITE_OTHER): Payer: 59 | Admitting: Obstetrics and Gynecology

## 2018-03-13 ENCOUNTER — Encounter: Payer: Self-pay | Admitting: Obstetrics and Gynecology

## 2018-03-13 VITALS — BP 101/46 | HR 76 | Wt 161.5 lb

## 2018-03-13 DIAGNOSIS — Z3492 Encounter for supervision of normal pregnancy, unspecified, second trimester: Secondary | ICD-10-CM

## 2018-03-13 DIAGNOSIS — Z3490 Encounter for supervision of normal pregnancy, unspecified, unspecified trimester: Secondary | ICD-10-CM

## 2018-03-13 NOTE — Patient Instructions (Signed)
If you have itching, burning or white discharge, use Monistat cream in the vagina which you can buy at any pharmacy.

## 2018-03-13 NOTE — Progress Notes (Signed)
   PRENATAL VISIT NOTE  Subjective:  Monique Patrick is a 32 y.o. G3P2002 at 3714w5d being seen today for ongoing prenatal care.  She is currently monitored for the following issues for this low-risk pregnancy and has Urticaria; Hypotension; Tension headache; Gastroesophageal reflux disease with esophagitis; and Encounter for supervision of low-risk pregnancy on their problem list.  Patient reports no complaints and minor nausea.  Contractions: Not present. Vag. Bleeding: None.  Movement: Present. Denies leaking of fluid.   The following portions of the patient's history were reviewed and updated as appropriate: allergies, current medications, past family history, past medical history, past social history, past surgical history and problem list. Problem list updated.  Objective:   Vitals:   03/13/18 0833  BP: (!) 101/46  Pulse: 76  Weight: 161 lb 8 oz (73.3 kg)    Fetal Status: Fetal Heart Rate (bpm): 161   Movement: Present     General:  Alert, oriented and cooperative. Patient is in no acute distress.  Skin: Skin is warm and dry. No rash noted.   Cardiovascular: Normal heart rate noted  Respiratory: Normal respiratory effort, no problems with respiration noted  Abdomen: Soft, gravid, appropriate for gestational age.  Pain/Pressure: Absent     Pelvic: Cervical exam deferred        Extremities: Normal range of motion.  Edema: None  Mental Status: Normal mood and affect. Normal behavior. Normal judgment and thought content.   Assessment and Plan:  Pregnancy: G3P2002 at 2814w5d  1. Encounter for supervision of low-risk pregnancy, antepartum No issues  Preterm labor symptoms and general obstetric precautions including but not limited to vaginal bleeding, contractions, leaking of fluid and fetal movement were reviewed in detail with the patient. Please refer to After Visit Summary for other counseling recommendations.  Return in about 1 month (around 04/10/2018) for OB visit.  Future  Appointments  Date Time Provider Department Center  04/10/2018  8:15 AM [facility] US 4 [facility]    Conan BowensKelly M Eavan Gonterman, MD

## 2018-04-10 ENCOUNTER — Ambulatory Visit (HOSPITAL_COMMUNITY)
Admission: RE | Admit: 2018-04-10 | Discharge: 2018-04-10 | Disposition: A | Discharge status: Home / Self Care | Payer: 59 | Source: Ambulatory Visit | Attending: Advanced Practice Midwife | Admitting: Advanced Practice Midwife

## 2018-04-10 ENCOUNTER — Ambulatory Visit (INDEPENDENT_AMBULATORY_CARE_PROVIDER_SITE_OTHER): Payer: 59 | Admitting: Advanced Practice Midwife

## 2018-04-10 ENCOUNTER — Other Ambulatory Visit: Payer: Self-pay | Admitting: Advanced Practice Midwife

## 2018-04-10 VITALS — BP 105/44 | HR 70 | Wt 163.1 lb

## 2018-04-10 DIAGNOSIS — Z3490 Encounter for supervision of normal pregnancy, unspecified, unspecified trimester: Secondary | ICD-10-CM | POA: Diagnosis present

## 2018-04-10 DIAGNOSIS — Z363 Encounter for antenatal screening for malformations: Secondary | ICD-10-CM | POA: Insufficient documentation

## 2018-04-10 DIAGNOSIS — Z3A19 19 weeks gestation of pregnancy: Secondary | ICD-10-CM | POA: Insufficient documentation

## 2018-04-10 DIAGNOSIS — Z3492 Encounter for supervision of normal pregnancy, unspecified, second trimester: Secondary | ICD-10-CM | POA: Diagnosis not present

## 2018-04-10 DIAGNOSIS — Z789 Other specified health status: Secondary | ICD-10-CM

## 2018-04-10 MED ORDER — PRENATAL PLUS 27-1 MG PO TABS: 1.0000 | ORAL_TABLET | Freq: Every day | ORAL | 12 refills | Status: DC

## 2018-04-10 NOTE — Addendum Note (Signed)
Encounter addended by: Levonne Hubert, RT on: 04/10/2018 10:08 AM  Actions taken: Imaging Exam ended

## 2018-04-10 NOTE — Progress Notes (Signed)
   PRENATAL VISIT NOTE  Subjective:  Monique Patrick is a 32 y.o. G3P2002 at [redacted]w[redacted]d being seen today for ongoing prenatal care.  She is currently monitored for the following issues for this low-risk pregnancy and has Urticaria; Hypotension; Tension headache; Gastroesophageal reflux disease with esophagitis; and Encounter for supervision of low-risk pregnancy on their problem list.  Patient reports no complaints.  Contractions: Not present. Vag. Bleeding: None.  Movement: Present. Denies leaking of fluid.   The following portions of the patient's history were reviewed and updated as appropriate: allergies, current medications, past family history, past medical history, past social history, past surgical history and problem list. Problem list updated.  Objective:   Vitals:   04/10/18 0951  BP: (!) 105/44  Pulse: 70  Weight: 163 lb 1.6 oz (74 kg)    Fetal Status: Fetal Heart Rate (bpm): 152   Movement: Present     General:  Alert, oriented and cooperative. Patient is in no acute distress.  Skin: Skin is warm and dry. No rash noted.   Cardiovascular: Normal heart rate noted  Respiratory: Normal respiratory effort, no problems with respiration noted  Abdomen: Soft, gravid, appropriate for gestational age.  Pain/Pressure: Present     Pelvic: Cervical exam deferred        Extremities: Normal range of motion.  Edema: None  Mental Status: Normal mood and affect. Normal behavior. Normal judgment and thought content.   Assessment and Plan:  Pregnancy: G3P2002 at [redacted]w[redacted]d  1. Encounter for supervision of low-risk pregnancy, antepartum --Answered pt questions about safety of fasting due to cultural/religious reasons in pregnancy.  Likely fasting for a few hours is safe if appropriate amount of PO intake, especially fluids, during non-fasting times.  --Reviewed today's normal anatomy US findings with pt and husband - prenatal vitamin w/FE, FA (PRENATAL 1 + 1) 27-1 MG TABS tablet; Take 1 tablet by  mouth daily at 12 noon.  Dispense: 30 each; Refill: 12  2. Language barrier affecting health care --Arabic with interpreter used for communication but pt speaks some English and interpreter used in room as needed for clarification.  Preterm labor symptoms and general obstetric precautions including but not limited to vaginal bleeding, contractions, leaking of fluid and fetal movement were reviewed in detail with the patient. Please refer to After Visit Summary for other counseling recommendations.  Return in about 1 month (around 05/08/2018).  No future appointments.  Sharen Counter, CNM

## 2018-04-10 NOTE — Patient Instructions (Signed)

## 2018-05-10 ENCOUNTER — Ambulatory Visit (INDEPENDENT_AMBULATORY_CARE_PROVIDER_SITE_OTHER): Payer: 59 | Admitting: Obstetrics and Gynecology

## 2018-05-10 VITALS — BP 95/46 | HR 86 | Wt 170.7 lb

## 2018-05-10 DIAGNOSIS — Z3492 Encounter for supervision of normal pregnancy, unspecified, second trimester: Secondary | ICD-10-CM

## 2018-05-11 ENCOUNTER — Encounter: Payer: Self-pay | Admitting: Obstetrics and Gynecology

## 2018-05-11 NOTE — Progress Notes (Signed)
Prenatal Visit Note Date: 05/10/2018 Clinic: [facility]  Subjective:  Monique Patrick is a 32 y.o. G3P2002 at 289w0d being seen today for ongoing prenatal care.  She is currently monitored for the following issues for this low-risk pregnancy and has Urticaria; Hypotension; Tension headache; Gastroesophageal reflux disease with esophagitis; and Encounter for supervision of low-risk pregnancy on their problem list.  Patient reports no complaints.   Contractions: Not present. Vag. Bleeding: None.  Movement: Present. Denies leaking of fluid.   The following portions of the patient's history were reviewed and updated as appropriate: allergies, current medications, past family history, past medical history, past social history, past surgical history and problem list. Problem list updated.  Objective:   Vitals:   05/10/18 1145  BP: (!) 95/46  Pulse: 86  Weight: 170 lb 11.2 oz (77.4 kg)    Fetal Status: Fetal Heart Rate (bpm): 150 Fundal Height: 24 cm Movement: Present     General:  Alert, oriented and cooperative. Patient is in no acute distress.  Skin: Skin is warm and dry. No rash noted.   Cardiovascular: Normal heart rate noted  Respiratory: Normal respiratory effort, no problems with respiration noted  Abdomen: Soft, gravid, appropriate for gestational age. Pain/Pressure: Absent     Pelvic:  Cervical exam deferred        Extremities: Normal range of motion.  Edema: Trace  Mental Status: Normal mood and affect. Normal behavior. Normal judgment and thought content.   Urinalysis:      Assessment and Plan:  Pregnancy: G3P2002 at 539w0d  1. Encounter for supervision of low-risk pregnancy in second trimester Routine care. Sleep strategies given. 28wk lbs nv.  Pt declined interpeter  Preterm labor symptoms and general obstetric precautions including but not limited to vaginal bleeding, contractions, leaking of fluid and fetal movement were reviewed in detail with  the patient. Please refer to After Visit Summary for other counseling recommendations.  Return in about 3 weeks (around 05/31/2018) for lob and 2hr gtt.    BingPickens, Dolores Ewing, MD

## 2018-05-24 ENCOUNTER — Encounter: Payer: 59 | Admitting: Nurse Practitioner

## 2018-05-31 ENCOUNTER — Other Ambulatory Visit: Payer: Self-pay | Admitting: General Practice

## 2018-05-31 ENCOUNTER — Other Ambulatory Visit: Payer: 59

## 2018-05-31 ENCOUNTER — Ambulatory Visit (INDEPENDENT_AMBULATORY_CARE_PROVIDER_SITE_OTHER): Payer: 59 | Admitting: Obstetrics and Gynecology

## 2018-05-31 VITALS — BP 106/55 | HR 82 | Wt 173.0 lb

## 2018-05-31 DIAGNOSIS — Z23 Encounter for immunization: Secondary | ICD-10-CM

## 2018-05-31 DIAGNOSIS — K2100 Gastro-esophageal reflux disease with esophagitis, without bleeding: Secondary | ICD-10-CM

## 2018-05-31 DIAGNOSIS — Z3493 Encounter for supervision of normal pregnancy, unspecified, third trimester: Secondary | ICD-10-CM

## 2018-05-31 MED ORDER — OMEPRAZOLE 40 MG PO CPDR: 40.0000 mg | DELAYED_RELEASE_CAPSULE | Freq: Every day | ORAL | 3 refills | Status: DC

## 2018-05-31 NOTE — Progress Notes (Signed)
   PRENATAL VISIT NOTE  Subjective:  Monique Patrick is a 32 y.o. G3P2002 at 3069w0d being seen today for ongoing prenatal care.  She is currently monitored for the following issues for this low-risk pregnancy and has Urticaria; Hypotension; Tension headache; Gastroesophageal reflux disease with esophagitis; and Encounter for supervision of low-risk pregnancy on their problem list.  Patient reports Insomnia .  Contractions: Not present. Vag. Bleeding: None.  Movement: Present. Denies leaking of fluid.   The following portions of the patient's history were reviewed and updated as appropriate: allergies, current medications, past family history, past medical history, past social history, past surgical history and problem list. Problem list updated.  Objective:   Vitals:   05/31/18 0946  BP: (!) 106/55  Pulse: 82  Weight: 173 lb (78.5 kg)    Fetal Status: Fetal Heart Rate (bpm): 146 Fundal Height: 27 cm Movement: Present     General:  Alert, oriented and cooperative. Patient is in no acute distress.  Skin: Skin is warm and dry. No rash noted.   Cardiovascular: Normal heart rate noted  Respiratory: Normal respiratory effort, no problems with respiration noted  Abdomen: Soft, gravid, appropriate for gestational age.  Pain/Pressure: Absent     Pelvic: Cervical exam deferred        Extremities: Normal range of motion.  Edema: None  Mental Status: Normal mood and affect. Normal behavior. Normal judgment and thought content.   Assessment and Plan:  Pregnancy: G3P2002 at 3069w0d  1. Encounter for supervision of low-risk pregnancy in third trimester  Continues to have insomnia. Ok for OTC unisom as directed.   2. Gastroesophageal reflux disease with esophagitis  - omeprazole (PRILOSEC) 40 MG capsule; Take 1 capsule (40 mg total) by mouth daily.  Dispense: 30 capsule; Refill: 3  3. Need for Tdap vaccination  - Tdap vaccine greater than or equal to 7yo IM  There are no diagnoses linked to  this encounter. Preterm labor symptoms and general obstetric precautions including but not limited to vaginal bleeding, contractions, leaking of fluid and fetal movement were reviewed in detail with the patient. Please refer to After Visit Summary for other counseling recommendations.  No follow-ups on file.  Future Appointments  Date Time Provider Department Center  06/05/2018  8:50 AM [facility]  06/20/2018  8:55 AM Crisoforo OxfordKooistra, Charlesetta GaribaldiKathryn Lorraine, CNM [facility]  07/04/2018  8:15 AM Marylene LandKooistra, Kathryn Lorraine, CNM [facility]  07/18/2018  8:15 AM Sharyon Cableogers, Veronica C, [facility]M [facility]    Venia CarbonJennifer Jarone Ostergaard, NP

## 2018-05-31 NOTE — Patient Instructions (Signed)
DTaP Vaccine (Diphtheria, Tetanus, and Pertussis): What You Need to Know 1. Why get vaccinated? Diphtheria, tetanus, and pertussis are serious diseases caused by bacteria. Diphtheria and pertussis are spread from person to person. Tetanus enters the body through cuts or wounds. DIPHTHERIA causes a thick covering in the back of the throat.  It can lead to breathing problems, paralysis, heart failure, and even death.  TETANUS (Lockjaw) causes painful tightening of the muscles, usually all over the body.  It can lead to "locking" of the jaw so the victim cannot open his mouth or swallow. Tetanus leads to death in up to 2 out of 10 cases.  PERTUSSIS (Whooping Cough) causes coughing spells so bad that it is hard for infants to eat, drink, or breathe. These spells can last for weeks.  It can lead to pneumonia, seizures (jerking and staring spells), brain damage, and death.  Diphtheria, tetanus, and pertussis vaccine (DTaP) can help prevent these diseases. Most children who are vaccinated with DTaP will be protected throughout childhood. Many more children would get these diseases if we stopped vaccinating. DTaP is a safer version of an older vaccine called DTP. DTP is no longer used in the [place]. 2. Who should get DTaP vaccine and when? Children should get 5 doses of DTaP vaccine, one dose at each of the following ages:  2 months  4 months  6 months  15-18 months  4-6 years  DTaP may be given at the same time as other vaccines. 3. Some children should not get DTaP vaccine or should wait  Children with minor illnesses, such as a cold, may be vaccinated. But children who are moderately or severely ill should usually wait until they recover before getting DTaP vaccine.  Any child who had a life-threatening allergic reaction after a dose of DTaP should not get another dose.  Any child who suffered a brain or nervous system disease within 7 days after a dose of DTaP should not get  another dose.  Talk with your doctor if your child: ? had a seizure or collapsed after a dose of DTaP, ? cried non-stop for 3 hours or more after a dose of DTaP, ? had a fever over 105F after a dose of DTaP. Ask your doctor for more information. Some of these children should not get another dose of pertussis vaccine, but may get a vaccine without pertussis, called DT. 4. Older children and adults DTaP is not licensed for adolescents, adults, or children 7 years of age and older. But older people still need protection. A vaccine called Tdap is similar to DTaP. A single dose of Tdap is recommended for people 11 through 30 years of age. Another vaccine, called Td, protects against tetanus and diphtheria, but not pertussis. It is recommended every 10 years. There are separate Vaccine Information Statements for these vaccines. 5. What are the risks from DTaP vaccine? Getting diphtheria, tetanus, or pertussis disease is much riskier than getting DTaP vaccine. However, a vaccine, like any medicine, is capable of causing serious problems, such as severe allergic reactions. The risk of DTaP vaccine causing serious harm, or death, is extremely small. Mild problems (common)  Fever (up to about 1 child in 4)  Redness or swelling where the shot was given (up to about 1 child in 4)  Soreness or tenderness where the shot was given (up to about 1 child in 4) These problems occur more often after the 4th and 5th doses of the DTaP series than after   earlier doses. Sometimes the 4th or 5th dose of DTaP vaccine is followed by swelling of the entire arm or leg in which the shot was given, lasting 1-7 days (up to about 1 child in 30). Other mild problems include:  Fussiness (up to about 1 child in 3)  Tiredness or poor appetite (up to about 1 child in 10)  Vomiting (up to about 1 child in 50) These problems generally occur 1-3 days after the shot. Moderate problems (uncommon)  Seizure (jerking or staring)  (about 1 child out of 14,000)  Non-stop crying, for 3 hours or more (up to about 1 child out of 1,000)  High fever, over 105F (about 1 child out of 16,000) Severe problems (very rare)  Serious allergic reaction (less than 1 out of a million doses)  Several other severe problems have been reported after DTaP vaccine. These include: ? Long-term seizures, coma, or lowered consciousness ? Permanent brain damage. These are so rare it is hard to tell if they are caused by the vaccine. Controlling fever is especially important for children who have had seizures, for any reason. It is also important if another family member has had seizures. You can reduce fever and pain by giving your child an aspirin-free pain reliever when the shot is given, and for the next 24 hours, following the package instructions. 6. What if there is a serious reaction? What should I look for? Look for anything that concerns you, such as signs of a severe allergic reaction, very high fever, or behavior changes. Signs of a severe allergic reaction can include hives, swelling of the face and throat, difficulty breathing, a fast heartbeat, dizziness, and weakness. These would start a few minutes to a few hours after the vaccination. What should I do?  If you think it is a severe allergic reaction or other emergency that can't wait, call 9-1-1 or get the person to the nearest hospital. Otherwise, call your doctor.  Afterward, the reaction should be reported to the Vaccine Adverse Event Reporting System (VAERS). Your doctor might file this report, or you can do it yourself through the VAERS web site at www.vaers.hhs.gov, or by calling 1-800-822-7967. ? VAERS is only for reporting reactions. They do not give medical advice. 7. The National Vaccine Injury Compensation Program The National Vaccine Injury Compensation Program (VICP) is a federal program that was created to compensate people who may have been injured by certain  vaccines. Persons who believe they may have been injured by a vaccine can learn about the program and about filing a claim by calling 1-800-338-2382 or visiting the VICP website at www.hrsa.gov/vaccinecompensation. 8. How can I learn more?  Ask your doctor.  Call your local or state health department.  Contact the Centers for Disease Control and Prevention (CDC): ? Call 1-800-232-4636 (1-800-CDC-INFO) or ? Visit CDC's website at www.cdc.gov/vaccines CDC DTaP Vaccine (Diphtheria, Tetanus, and Pertussis) VIS (04/12/06) This information is not intended to replace advice given to you by your health care provider. Make sure you discuss any questions you have with your health care provider. Document Released: 09/10/2006 Document Revised: 08/03/2016 Document Reviewed: 08/03/2016 Elsevier Interactive Patient Education  2017 Elsevier Inc.  

## 2018-05-31 NOTE — Progress Notes (Signed)
interpreter Monique Patrick  TDAP Vaccine given today

## 2018-06-05 ENCOUNTER — Other Ambulatory Visit: Payer: 59

## 2018-06-05 DIAGNOSIS — Z3493 Encounter for supervision of normal pregnancy, unspecified, third trimester: Secondary | ICD-10-CM

## 2018-06-06 LAB — CBC
HEMOGLOBIN: 11.3 g/dL (ref 11.1–15.9)
Hematocrit: 34.4 % (ref 34.0–46.6)
MCH: 29.5 pg (ref 26.6–33.0)
MCHC: 32.8 g/dL (ref 31.5–35.7)
MCV: 90 fL (ref 79–97)
PLATELETS: 263 10*3/uL (ref 150–450)
RBC: 3.83 x10E6/uL (ref 3.77–5.28)
RDW: 13.4 % (ref 12.3–15.4)
WBC: 8.6 10*3/uL (ref 3.4–10.8)

## 2018-06-06 LAB — GLUCOSE TOLERANCE, 2 HOURS W/ 1HR
Glucose, 1 hour: 151 mg/dL (ref 65–179)
Glucose, 2 hour: 122 mg/dL (ref 65–152)
Glucose, Fasting: 84 mg/dL (ref 65–91)

## 2018-06-06 LAB — HIV ANTIBODY (ROUTINE TESTING W REFLEX): HIV SCREEN 4TH GENERATION: NONREACTIVE

## 2018-06-06 LAB — RPR: RPR Ser Ql: NONREACTIVE

## 2018-06-20 ENCOUNTER — Ambulatory Visit (INDEPENDENT_AMBULATORY_CARE_PROVIDER_SITE_OTHER): Payer: 59 | Admitting: Student

## 2018-06-20 DIAGNOSIS — Z3493 Encounter for supervision of normal pregnancy, unspecified, third trimester: Secondary | ICD-10-CM

## 2018-06-20 NOTE — Patient Instructions (Signed)

## 2018-06-20 NOTE — Progress Notes (Signed)
   PRENATAL VISIT NOTE  Subjective:  Monique Patrick is a 32 y.o. G3P2002 at 545w6d being seen today for ongoing prenatal care.  She is currently monitored for the following issues for this low-risk pregnancy and has Urticaria; Hypotension; Tension headache; Gastroesophageal reflux disease with esophagitis; and Encounter for supervision of low-risk pregnancy on their problem list.  Patient reports no complaints.  Contractions: Not present. Vag. Bleeding: None.  Movement: Present. Denies leaking of fluid.   The following portions of the patient's history were reviewed and updated as appropriate: allergies, current medications, past family history, past medical history, past social history, past surgical history and problem list. Problem list updated.  Objective:   Vitals:   06/20/18 0854  BP: (!) 104/58  Pulse: 93  Weight: 180 lb 14.4 oz (82.1 kg)    Fetal Status: Fetal Heart Rate (bpm): 149 Fundal Height: 28 cm Movement: Present     General:  Alert, oriented and cooperative. Patient is in no acute distress.  Skin: Skin is warm and dry. No rash noted.   Cardiovascular: Normal heart rate noted  Respiratory: Normal respiratory effort, no problems with respiration noted  Abdomen: Soft, gravid, appropriate for gestational age.  Pain/Pressure: Absent     Pelvic: Cervical exam deferred        Extremities: Normal range of motion.  Edema: None  Mental Status: Normal mood and affect. Normal behavior. Normal judgment and thought content.   Assessment and Plan:  Pregnancy: G3P2002 at 925w6d  1. Encounter for supervision of low-risk pregnancy in third trimester Wants contraception; has had nexplanon but she didn't like it bc she said it made her heart pound.  2. Discussed pediatrician  Preterm labor symptoms and general obstetric precautions including but not limited to vaginal bleeding, contractions, leaking of fluid and fetal movement were reviewed in detail with the patient. Please refer to  After Visit Summary for other counseling recommendations.  No follow-ups on file.  Future Appointments  Date Time Provider Department Center  07/04/2018  8:15 AM Marylene LandKooistra, Kathryn Lorraine, [facility]M [facility]  07/18/2018  8:15 AM Sharyon Cableogers, Veronica C, CNM [facility]    Marylene LandKathryn Lorraine Kooistra, [place]

## 2018-07-04 ENCOUNTER — Ambulatory Visit (INDEPENDENT_AMBULATORY_CARE_PROVIDER_SITE_OTHER): Payer: 59 | Admitting: Student

## 2018-07-04 VITALS — BP 107/51 | HR 82 | Wt 182.0 lb

## 2018-07-04 DIAGNOSIS — Z3493 Encounter for supervision of normal pregnancy, unspecified, third trimester: Secondary | ICD-10-CM

## 2018-07-04 NOTE — Progress Notes (Signed)
C/o Of ear pain , Right leg hurts like she's pulling a muscle and she feels as if she will fall.

## 2018-07-04 NOTE — Progress Notes (Signed)
Patient ID: Monique Hotterhssan Peet, female   DOB: 05/01/1986, 32 y.o.   MRN: [id number]   PRENATAL VISIT NOTE  Subjective:  Monique Patrick is a 32 y.o. G3P2002 at 5275w6d being seen today for ongoing prenatal care.  She is currently monitored for the following issues for this low-risk pregnancy and has Urticaria; Hypotension; Tension headache; Gastroesophageal reflux disease with esophagitis; and Encounter for supervision of low-risk pregnancy on their problem list.  Patient reports occasional leg when she stands up and also at night. Feels like something is in her ear, has a the sound of air in her ear. .  Contractions: Not present. Vag. Bleeding: None.  Movement: Present. Denies leaking of fluid.   The following portions of the patient's history were reviewed and updated as appropriate: allergies, current medications, past family history, past medical history, past social history, past surgical history and problem list. Problem list updated.  Objective:   Vitals:   07/04/18 0821  BP: (!) 107/51  Pulse: 82  Weight: 182 lb (82.6 kg)    Fetal Status: Fetal Heart Rate (bpm): 158 Fundal Height: 32 cm Movement: Present     General:  Alert, oriented and cooperative. Patient is in no acute distress.  Skin: Skin is warm and dry. No rash noted.   Cardiovascular: Normal heart rate noted  Respiratory: Normal respiratory effort, no problems with respiration noted  Abdomen: Soft, gravid, appropriate for gestational age.  Pain/Pressure: Present     Pelvic: Cervical exam deferred        Extremities: Normal range of motion.  Edema: None  Mental Status: Normal mood and affect. Normal behavior. Normal judgment and thought content.   Assessment and Plan:  Pregnancy: G3P2002 at 4975w6d  1. Encounter for supervision of low-risk pregnancy in third trimester    -Patient doing well; Ears are clear with no signs of infection, redness, bulging TM.  -Recommend maternity support belt and increased hydration for leg  pain.  -Still does not want any BC.   Preterm labor symptoms and general obstetric precautions including but not limited to vaginal bleeding, contractions, leaking of fluid and fetal movement were reviewed in detail with the patient. Please refer to After Visit Summary for other counseling recommendations.  No follow-ups on file.  Future Appointments  Date Time Provider Department Center  07/18/2018  8:15 AM Sharyon Cableogers, Veronica C, CNM [facility]    Charlesetta GaribaldiKathryn Lorraine ButlerKooistra, [place]

## 2018-07-04 NOTE — Patient Instructions (Signed)

## 2018-07-18 ENCOUNTER — Encounter: Payer: Self-pay | Admitting: Certified Nurse Midwife

## 2018-07-18 ENCOUNTER — Ambulatory Visit (INDEPENDENT_AMBULATORY_CARE_PROVIDER_SITE_OTHER): Payer: 59 | Admitting: Certified Nurse Midwife

## 2018-07-18 VITALS — BP 105/57 | HR 96 | Wt 186.7 lb

## 2018-07-18 DIAGNOSIS — Z3493 Encounter for supervision of normal pregnancy, unspecified, third trimester: Secondary | ICD-10-CM

## 2018-07-18 NOTE — Progress Notes (Signed)
Pt doesn't need interpreter anymore.

## 2018-07-18 NOTE — Progress Notes (Signed)
   PRENATAL VISIT NOTE  Subjective:  Monique Patrick is a 32 y.o. G3P2002 at 224w6d being seen today for ongoing prenatal care.  She is currently monitored for the following issues for this low-risk pregnancy and has Urticaria; Hypotension; Tension headache; Gastroesophageal reflux disease with esophagitis; and Encounter for supervision of low-risk pregnancy on their problem list.  Patient reports no complaints.  Contractions: Not present. Vag. Bleeding: None.  Movement: Present. Denies leaking of fluid.   The following portions of the patient's history were reviewed and updated as appropriate: allergies, current medications, past family history, past medical history, past social history, past surgical history and problem list. Problem list updated.  Objective:   Vitals:   07/18/18 0823  BP: (!) 105/57  Pulse: 96  Weight: 186 lb 11.2 oz (84.7 kg)    Fetal Status: Fetal Heart Rate (bpm): 154 Fundal Height: 31 cm Movement: Present  Presentation: Vertex  General:  Alert, oriented and cooperative. Patient is in no acute distress.  Skin: Skin is warm and dry. No rash noted.   Cardiovascular: Normal heart rate noted  Respiratory: Normal respiratory effort, no problems with respiration noted  Abdomen: Soft, gravid, appropriate for gestational age.  Pain/Pressure: Absent     Pelvic: Cervical exam deferred        Extremities: Normal range of motion.  Edema: None  Mental Status: Normal mood and affect. Normal behavior. Normal judgment and thought content.   Assessment and Plan:  Pregnancy: G3P2002 at 8924w6d  1. Encounter for supervision of low-risk pregnancy in third trimester -Patient doing well, no complaints  -Patient reports ear concerns from last visit have resolved  -Anticipatory guidance on upcoming appointment with GBS screening, educated and discussed GBS screening with recommendation of antibiotics during labor if screening is positive- patient verbalizes understanding and agreeable.    Preterm labor symptoms and general obstetric precautions including but not limited to vaginal bleeding, contractions, leaking of fluid and fetal movement were reviewed in detail with the patient. Please refer to After Visit Summary for other counseling recommendations.  Return in about 15 days (around 08/02/2018) for ROB.  Future Appointments  Date Time Provider Department Center  08/02/2018  9:15 AM Rasch, Harolyn RutherfordJennifer I, NP [facility]    Sharyon CableVeronica C Dabney Schanz, CNM

## 2018-08-02 ENCOUNTER — Ambulatory Visit (INDEPENDENT_AMBULATORY_CARE_PROVIDER_SITE_OTHER): Payer: 59 | Admitting: Obstetrics and Gynecology

## 2018-08-02 VITALS — BP 110/58 | HR 92 | Wt 187.0 lb

## 2018-08-02 DIAGNOSIS — Z113 Encounter for screening for infections with a predominantly sexual mode of transmission: Secondary | ICD-10-CM

## 2018-08-02 DIAGNOSIS — Z3493 Encounter for supervision of normal pregnancy, unspecified, third trimester: Secondary | ICD-10-CM

## 2018-08-02 NOTE — Patient Instructions (Signed)

## 2018-08-02 NOTE — Progress Notes (Signed)
   PRENATAL VISIT NOTE  Subjective:  Monique Patrick is a 32 y.o. G3P2002 at [redacted]w[redacted]d being seen today for ongoing prenatal care.  She is currently monitored for the following issues for this low-risk pregnancy and has Urticaria; Hypotension; Tension headache; Gastroesophageal reflux disease with esophagitis; and Encounter for supervision of low-risk pregnancy on their problem list.  Patient reports no complaints.  Contractions: Irritability. Vag. Bleeding: None.  Movement: Present. Denies leaking of fluid.   The following portions of the patient's history were reviewed and updated as appropriate: allergies, current medications, past family history, past medical history, past social history, past surgical history and problem list. Problem list updated.  Objective:   Vitals:   08/02/18 1023  BP: (!) 110/58  Pulse: 92  Weight: 187 lb (84.8 kg)    Fetal Status: Fetal Heart Rate (bpm): 154 Fundal Height: 35 cm Movement: Present     General:  Alert, oriented and cooperative. Patient is in no acute distress.  Skin: Skin is warm and dry. No rash noted.   Cardiovascular: Normal heart rate noted  Respiratory: Normal respiratory effort, no problems with respiration noted  Abdomen: Soft, gravid, appropriate for gestational age.  Pain/Pressure: Present     Pelvic: Cervical exam deferred        Extremities: Normal range of motion.  Edema: None  Mental Status: Normal mood and affect. Normal behavior. Normal judgment and thought content.   Assessment and Plan:  Pregnancy: G3P2002 at [redacted]w[redacted]d  1. Encounter for supervision of low-risk pregnancy in third trimester  - Culture, beta strep (group b only) - GC/Chlamydia probe amp ([place])not at [facility] - Doing well.   Preterm labor symptoms and general obstetric precautions including but not limited to vaginal bleeding, contractions, leaking of fluid and fetal movement were reviewed in detail with the patient. Please refer to After Visit Summary for  other counseling recommendations.  Return in about 1 week (around 08/09/2018).  No future appointments.  Venia Carbon, NP

## 2018-08-05 LAB — GC/CHLAMYDIA PROBE AMP (~~LOC~~) NOT AT ARMC
CHLAMYDIA, DNA PROBE: NEGATIVE
NEISSERIA GONORRHEA: NEGATIVE

## 2018-08-06 LAB — CULTURE, BETA STREP (GROUP B ONLY): Strep Gp B Culture: NEGATIVE

## 2018-08-09 ENCOUNTER — Ambulatory Visit (INDEPENDENT_AMBULATORY_CARE_PROVIDER_SITE_OTHER): Payer: 59 | Admitting: Nurse Practitioner

## 2018-08-09 DIAGNOSIS — Z3493 Encounter for supervision of normal pregnancy, unspecified, third trimester: Secondary | ICD-10-CM

## 2018-08-09 NOTE — Progress Notes (Signed)
    Subjective:  Monique Patrick is a 32 y.o. G3P2002 at 5558w0d being seen today for ongoing prenatal care.  She is currently monitored for the following issues for this low-risk pregnancy and has Urticaria; Hypotension; Tension headache; Gastroesophageal reflux disease with esophagitis; and Encounter for supervision of low-risk pregnancy on their problem list.  Patient reports no complaints.  Contractions: Irritability. Vag. Bleeding: None.  Movement: Present. Denies leaking of fluid.   The following portions of the patient's history were reviewed and updated as appropriate: allergies, current medications, past family history, past medical history, past social history, past surgical history and problem list. Problem list updated.  Objective:   Vitals:   08/09/18 0859  BP: (!) 107/57  Pulse: 92  Weight: 188 lb 12.8 oz (85.6 kg)    Fetal Status: Fetal Heart Rate (bpm): 143 Fundal Height: 37 cm Movement: Present     General:  Alert, oriented and cooperative. Patient is in no acute distress.  Skin: Skin is warm and dry. No rash noted.   Cardiovascular: Normal heart rate noted  Respiratory: Normal respiratory effort, no problems with respiration noted  Abdomen: Soft, gravid, appropriate for gestational age. Pain/Pressure: Present     Pelvic:  Cervical exam deferred        Extremities: Normal range of motion.  Edema: None  Mental Status: Normal mood and affect. Normal behavior. Normal judgment and thought content.   Urinalysis:      Assessment and Plan:  Pregnancy: G3P2002 at 2258w0d  1. Encounter for supervision of low-risk pregnancy in third trimester Talked at length about positioning of the baby for breastfeeding and how to know the baby is getting enough milk. Not having many contractions.  Term labor symptoms and general obstetric precautions including but not limited to vaginal bleeding, contractions, leaking of fluid and fetal movement were reviewed in detail with the  patient. Please refer to After Visit Summary for other counseling recommendations.  Return in about 1 week (around 08/16/2018).  Nolene BernheimERRI Diante Barley, RN, MSN, NP-BC Nurse Practitioner, [facility], [facility] 08/09/2018 9:26 AM

## 2018-08-13 IMAGING — DX DG ABDOMEN 2V
2 series · 2 of 2 positions shown · non-contrast
Comparison: None.

CLINICAL DATA: Abdominal pain

EXAM:
ABDOMEN - 2 VIEW

[abdomen erect]
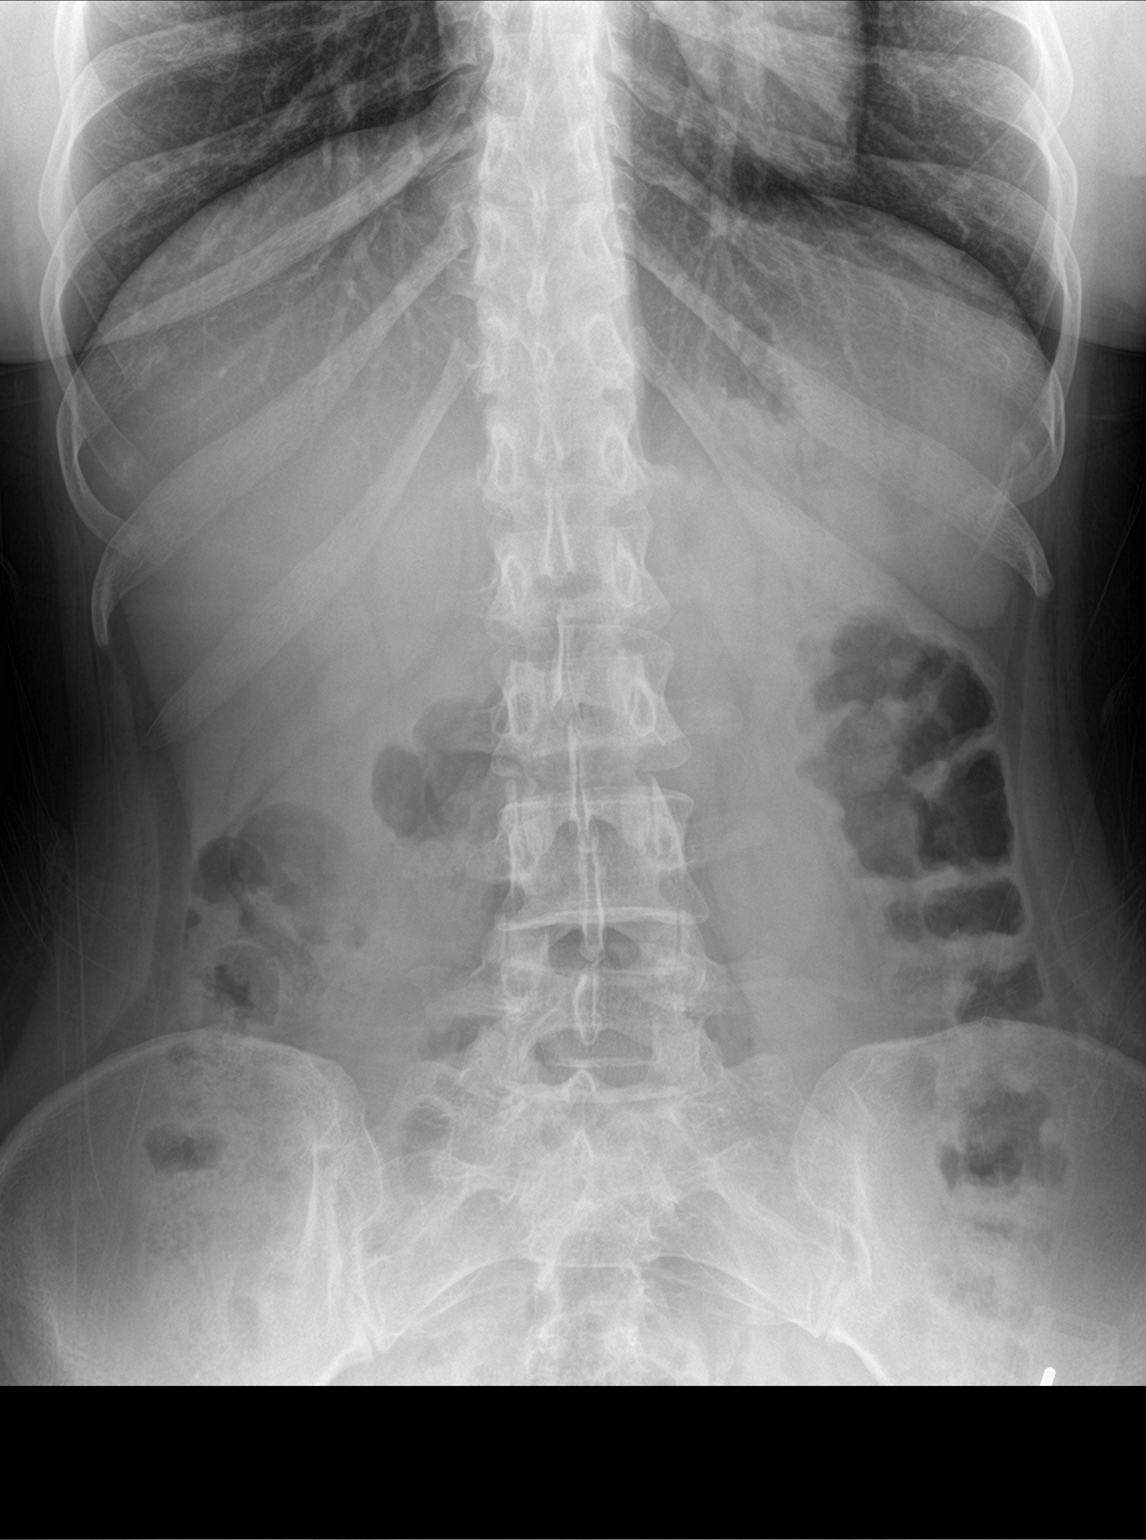

[abdomen supine]
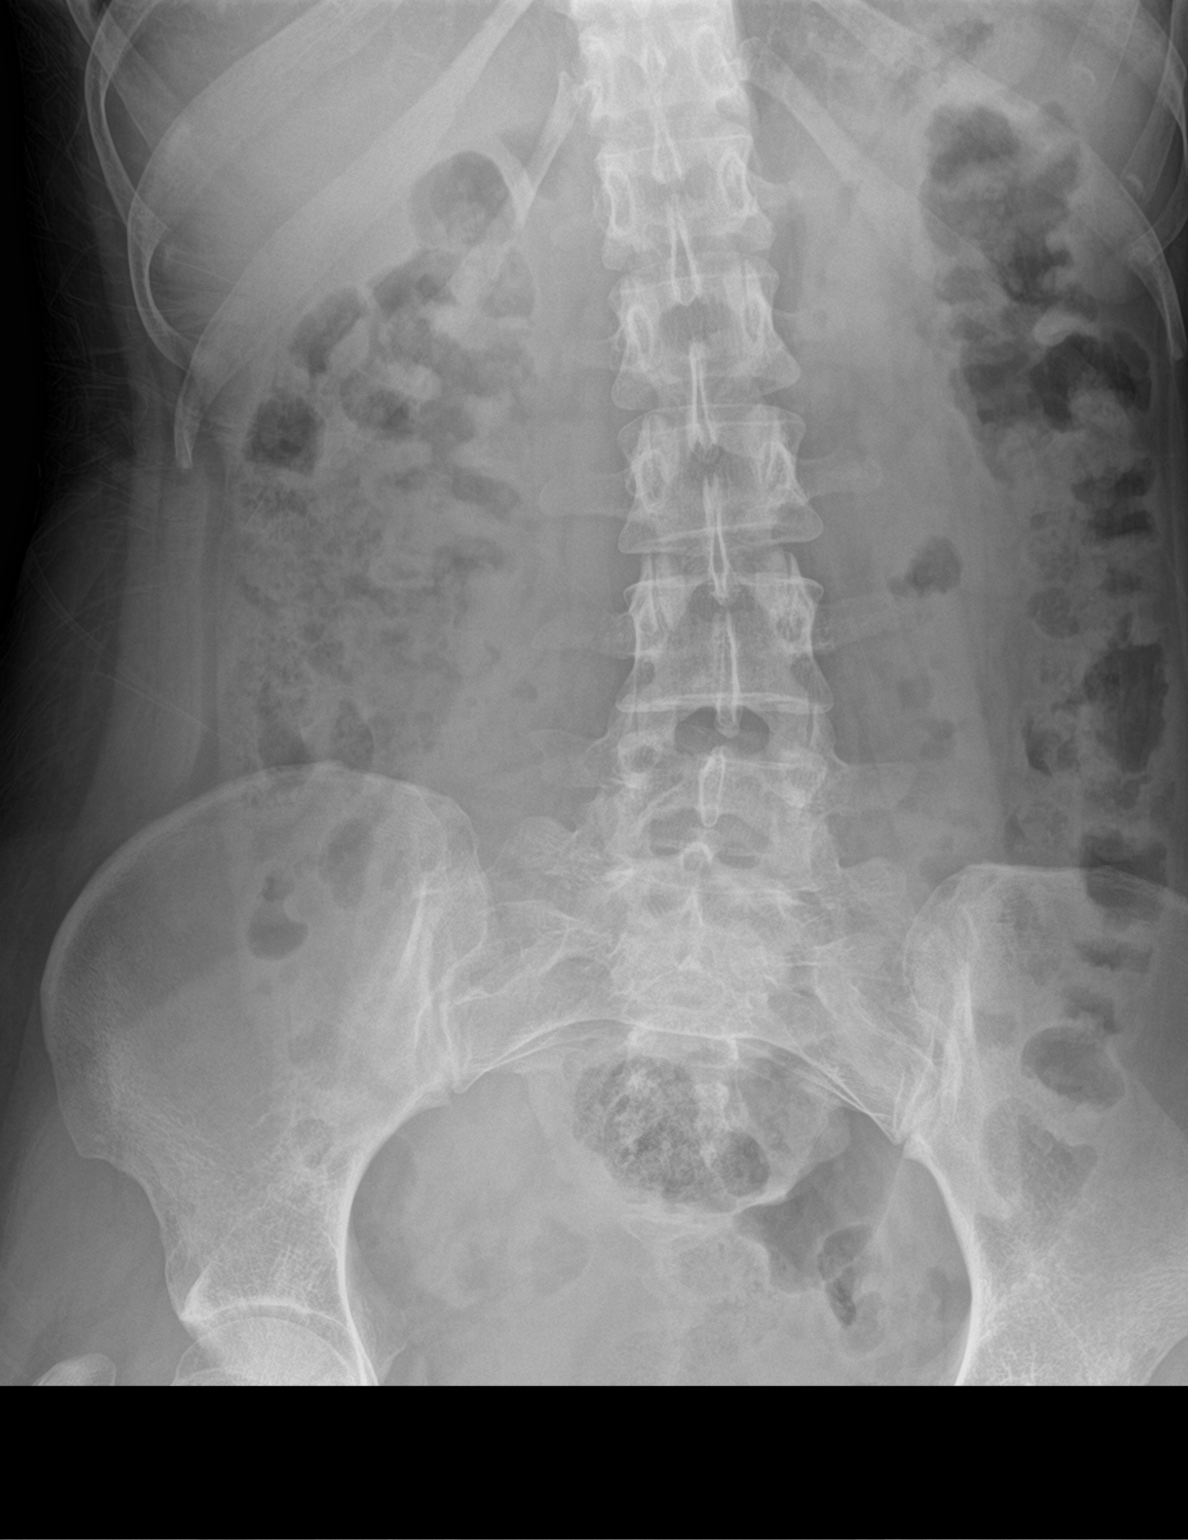

[2 of 2 positions shown; findings below may reference images not displayed]

FINDINGS: Scattered large and small bowel gas is noted. No obstructive changes
are seen. No free air is noted. No abnormal mass or abnormal
calcifications are noted. No bony abnormality is seen.
IMPRESSION: No acute abnormality noted.

## 2018-08-19 ENCOUNTER — Ambulatory Visit (INDEPENDENT_AMBULATORY_CARE_PROVIDER_SITE_OTHER): Payer: 59 | Admitting: Student

## 2018-08-19 DIAGNOSIS — Z3493 Encounter for supervision of normal pregnancy, unspecified, third trimester: Secondary | ICD-10-CM

## 2018-08-19 NOTE — Progress Notes (Addendum)
   PRENATAL VISIT NOTE  Subjective:  Monique Patrick is a 32 y.o. G3P2002 at 886w3d being seen today for ongoing prenatal care.  She is currently monitored for the following issues for this low-risk pregnancy and has Urticaria; Hypotension; Tension headache; Gastroesophageal reflux disease with esophagitis; and Encounter for supervision of low-risk pregnancy on their problem list.  Patient reports occasional contractions.  Contractions: Irregular. Vag. Bleeding: None.  Movement: Present. Denies leaking of fluid.   The following portions of the patient's history were reviewed and updated as appropriate: allergies, current medications, past family history, past medical history, past social history, past surgical history and problem list. Problem list updated.  Objective:   Vitals:   08/19/18 0942  BP: 107/64  Pulse: 92  Weight: 188 lb (85.3 kg)    Fetal Status: Fetal Heart Rate (bpm): 138 Fundal Height: 37 cm Movement: Present     General:  Alert, oriented and cooperative. Patient is in no acute distress.  Skin: Skin is warm and dry. No rash noted.   Cardiovascular: Normal heart rate noted  Respiratory: Normal respiratory effort, no problems with respiration noted  Abdomen: Soft, gravid, appropriate for gestational age.  Pain/Pressure: Present     Pelvic: Cervical exam deferred        Extremities: Normal range of motion.  Edema: None  Mental Status: Normal mood and affect. Normal behavior. Normal judgment and thought content.   Assessment and Plan:  Pregnancy: G3P2002 at 646w3d  1. Encounter for supervision of low-risk pregnancy in third trimester -Patient reported irregular contractions lasting less than 60 minutes. Discussed to come in once they were longer that 60 seconds and there were more than 5 contractions in one hour.  -Discussed contraception, patient still unsure.   Term labor symptoms and general obstetric precautions including but not limited to vaginal bleeding,  contractions, leaking of fluid and fetal movement were reviewed in detail with the patient. Please refer to After Visit Summary for other counseling recommendations.  Return in about 1 week (around 08/26/2018), or LROB.  No future appointments.  Charyl DancerErin Shandricka Monroy, Student-PA   I confirm that I have verified the information documented in the physician assistant note's note and that I have also personally reperformed the physical exam and all medical decision making activities.   Luna KitchensKathryn Kooistra

## 2018-08-19 NOTE — Patient Instructions (Signed)
Vaginal Delivery Vaginal delivery means that you will give birth by pushing your baby out of your birth canal (vagina). A team of health care providers will help you before, during, and after vaginal delivery. Birth experiences are unique for every woman and every pregnancy, and birth experiences vary depending on where you choose to give birth. What should I do to prepare for my baby's birth? Before your baby is born, it is important to talk with your health care provider about:  Your labor and delivery preferences. These may include: ? Medicines that you may be given. ? How you will manage your pain. This might include non-medical pain relief techniques or injectable pain relief such as epidural analgesia. ? How you and your baby will be monitored during labor and delivery. ? Who may be in the labor and delivery room with you. ? Your feelings about surgical delivery of your baby (cesarean delivery, or C-section) if this becomes necessary. ? Your feelings about receiving donated blood through an IV tube (blood transfusion) if this becomes necessary.  Whether you are able: ? To take pictures or videos of the birth. ? To eat during labor and delivery. ? To move around, walk, or change positions during labor and delivery.  What to expect after your baby is born, such as: ? Whether delayed umbilical cord clamping and cutting is offered. ? Who will care for your baby right after birth. ? Medicines or tests that may be recommended for your baby. ? Whether breastfeeding is supported in your hospital or birth center. ? How long you will be in the hospital or birth center.  How any medical conditions you have may affect your baby or your labor and delivery experience.  To prepare for your baby's birth, you should also:  Attend all of your health care visits before delivery (prenatal visits) as recommended by your health care provider. This is important.  Prepare your home for your baby's  arrival. Make sure that you have: ? Diapers. ? Baby clothing. ? Feeding equipment. ? Safe sleeping arrangements for you and your baby.  Install a car seat in your vehicle. Have your car seat checked by a certified car seat installer to make sure that it is installed safely.  Think about who will help you with your new baby at home for at least the first several weeks after delivery.  What can I expect when I arrive at the birth center or hospital? Once you are in labor and have been admitted into the hospital or birth center, your health care provider may:  Review your pregnancy history and any concerns you have.  Insert an IV tube into one of your veins. This is used to give you fluids and medicines.  Check your blood pressure, pulse, temperature, and heart rate (vital signs).  Check whether your bag of water (amniotic sac) has broken (ruptured).  Talk with you about your birth plan and discuss pain control options.  Monitoring Your health care provider may monitor your contractions (uterine monitoring) and your baby's heart rate (fetal monitoring). You may need to be monitored:  Often, but not continuously (intermittently).  All the time or for long periods at a time (continuously). Continuous monitoring may be needed if: ? You are taking certain medicines, such as medicine to relieve pain or make your contractions stronger. ? You have pregnancy or labor complications.  Monitoring may be done by:  Placing a special stethoscope or a handheld monitoring device on your abdomen to   check your baby's heartbeat, and feeling your abdomen for contractions. This method of monitoring does not continuously record your baby's heartbeat or your contractions.  Placing monitors on your abdomen (external monitors) to record your baby's heartbeat and the frequency and length of contractions. You may not have to wear external monitors all the time.  Placing monitors inside of your uterus  (internal monitors) to record your baby's heartbeat and the frequency, length, and strength of your contractions. ? Your health care provider may use internal monitors if he or she needs more information about the strength of your contractions or your baby's heart rate. ? Internal monitors are put in place by passing a thin, flexible wire through your vagina and into your uterus. Depending on the type of monitor, it may remain in your uterus or on your baby's head until birth. ? Your health care provider will discuss the benefits and risks of internal monitoring with you and will ask for your permission before inserting the monitors.  Telemetry. This is a type of continuous monitoring that can be done with external or internal monitors. Instead of having to stay in bed, you are able to move around during telemetry. Ask your health care provider if telemetry is an option for you.  Physical exam Your health care provider may perform a physical exam. This may include:  Checking whether your baby is positioned: ? With the head toward your vagina (head-down). This is most common. ? With the head toward the top of your uterus (head-up or breech). If your baby is in a breech position, your health care provider may try to turn your baby to a head-down position so you can deliver vaginally. If it does not seem that your baby can be born vaginally, your provider may recommend surgery to deliver your baby. In rare cases, you may be able to deliver vaginally if your baby is head-up (breech delivery). ? Lying sideways (transverse). Babies that are lying sideways cannot be delivered vaginally.  Checking your cervix to determine: ? Whether it is thinning out (effacing). ? Whether it is opening up (dilating). ? How low your baby has moved into your birth canal.  What are the three stages of labor and delivery?  Normal labor and delivery is divided into the following three stages: Stage 1  Stage 1 is the  longest stage of labor, and it can last for hours or days. Stage 1 includes: ? Early labor. This is when contractions may be irregular, or regular and mild. Generally, early labor contractions are more than 10 minutes apart. ? Active labor. This is when contractions get longer, more regular, more frequent, and more intense. ? The transition phase. This is when contractions happen very close together, are very intense, and may last longer than during any other part of labor.  Contractions generally feel mild, infrequent, and irregular at first. They get stronger, more frequent (about every 2-3 minutes), and more regular as you progress from early labor through active labor and transition.  Many women progress through stage 1 naturally, but you may need help to continue making progress. If this happens, your health care provider may talk with you about: ? Rupturing your amniotic sac if it has not ruptured yet. ? Giving you medicine to help make your contractions stronger and more frequent.  Stage 1 ends when your cervix is completely dilated to 4 inches (10 cm) and completely effaced. This happens at the end of the transition phase. Stage 2  Once   your cervix is completely effaced and dilated to 4 inches (10 cm), you may start to feel an urge to push. It is common for the body to naturally take a rest before feeling the urge to push, especially if you received an epidural or certain other pain medicines. This rest period may last for up to 1-2 hours, depending on your unique labor experience.  During stage 2, contractions are generally less painful, because pushing helps relieve contraction pain. Instead of contraction pain, you may feel stretching and burning pain, especially when the widest part of your baby's head passes through the vaginal opening (crowning).  Your health care provider will closely monitor your pushing progress and your baby's progress through the vagina during stage 2.  Your  health care provider may massage the area of skin between your vaginal opening and anus (perineum) or apply warm compresses to your perineum. This helps it stretch as the baby's head starts to crown, which can help prevent perineal tearing. ? In some cases, an incision may be made in your perineum (episiotomy) to allow the baby to pass through the vaginal opening. An episiotomy helps to make the opening of the vagina larger to allow more room for the baby to fit through.  It is very important to breathe and focus so your health care provider can control the delivery of your baby's head. Your health care provider may have you decrease the intensity of your pushing, to help prevent perineal tearing.  After delivery of your baby's head, the shoulders and the rest of the body generally deliver very quickly and without difficulty.  Once your baby is delivered, the umbilical cord may be cut right away, or this may be delayed for 1-2 minutes, depending on your baby's health. This may vary among health care providers, hospitals, and birth centers.  If you and your baby are healthy enough, your baby may be placed on your chest or abdomen to help maintain the baby's temperature and to help you bond with each other. Some mothers and babies start breastfeeding at this time. Your health care team will dry your baby and help keep your baby warm during this time.  Your baby may need immediate care if he or she: ? Showed signs of distress during labor. ? Has a medical condition. ? Was born too early (prematurely). ? Had a bowel movement before birth (meconium). ? Shows signs of difficulty transitioning from being inside the uterus to being outside of the uterus. If you are planning to breastfeed, your health care team will help you begin a feeding. Stage 3  The third stage of labor starts immediately after the birth of your baby and ends after you deliver the placenta. The placenta is an organ that develops  during pregnancy to provide oxygen and nutrients to your baby in the womb.  Delivering the placenta may require some pushing, and you may have mild contractions. Breastfeeding can stimulate contractions to help you deliver the placenta.  After the placenta is delivered, your uterus should tighten (contract) and become firm. This helps to stop bleeding in your uterus. To help your uterus contract and to control bleeding, your health care provider may: ? Give you medicine by injection, through an IV tube, by mouth, or through your rectum (rectally). ? Massage your abdomen or perform a vaginal exam to remove any blood clots that are left in your uterus. ? Empty your bladder by placing a thin, flexible tube (catheter) into your bladder. ? Encourage   you to breastfeed your baby. After labor is over, you and your baby will be monitored closely to ensure that you are both healthy until you are ready to go home. Your health care team will teach you how to care for yourself and your baby. This information is not intended to replace advice given to you by your health care provider. Make sure you discuss any questions you have with your health care provider. Document Released: 08/22/2008 Document Revised: 06/02/2016 Document Reviewed: 11/28/2015 Elsevier Interactive Patient Education  2018 [employer]  

## 2018-08-25 ENCOUNTER — Encounter (HOSPITAL_COMMUNITY): Payer: Self-pay | Admitting: *Deleted

## 2018-08-25 ENCOUNTER — Inpatient Hospital Stay (HOSPITAL_COMMUNITY)
Admission: AD | Admit: 2018-08-25 | Discharge: 2018-08-26 | DRG: 807 | Disposition: A | Discharge status: Home / Self Care | Payer: 59 | Attending: Family Medicine | Admitting: Family Medicine

## 2018-08-25 DIAGNOSIS — Z3493 Encounter for supervision of normal pregnancy, unspecified, third trimester: Secondary | ICD-10-CM

## 2018-08-25 DIAGNOSIS — O9962 Diseases of the digestive system complicating childbirth: Principal | ICD-10-CM | POA: Diagnosis present

## 2018-08-25 DIAGNOSIS — K2100 Gastro-esophageal reflux disease with esophagitis, without bleeding: Secondary | ICD-10-CM | POA: Diagnosis present

## 2018-08-25 DIAGNOSIS — G44209 Tension-type headache, unspecified, not intractable: Secondary | ICD-10-CM | POA: Diagnosis present

## 2018-08-25 DIAGNOSIS — Z3A39 39 weeks gestation of pregnancy: Secondary | ICD-10-CM

## 2018-08-25 DIAGNOSIS — Z3483 Encounter for supervision of other normal pregnancy, third trimester: Secondary | ICD-10-CM | POA: Diagnosis present

## 2018-08-25 DIAGNOSIS — O9989 Other specified diseases and conditions complicating pregnancy, childbirth and the puerperium: Secondary | ICD-10-CM | POA: Diagnosis present

## 2018-08-25 LAB — RPR: RPR Ser Ql: NONREACTIVE

## 2018-08-25 LAB — CBC
HEMATOCRIT: 38 % (ref 36.0–46.0)
HEMOGLOBIN: 12.3 g/dL (ref 12.0–15.0)
MCH: 29 pg (ref 26.0–34.0)
MCHC: 32.4 g/dL (ref 30.0–36.0)
MCV: 89.6 fL (ref 78.0–100.0)
Platelets: 250 10*3/uL (ref 150–400)
RBC: 4.24 MIL/uL (ref 3.87–5.11)
RDW: 15.2 % (ref 11.5–15.5)
WBC: 10 10*3/uL (ref 4.0–10.5)

## 2018-08-25 LAB — TYPE AND SCREEN
ABO/RH(D): O POS
Antibody Screen: NEGATIVE

## 2018-08-25 LAB — ABO/RH: ABO/RH(D): O POS

## 2018-08-25 MED ORDER — FLEET ENEMA 7-19 GM/118ML RE ENEM: 1.0000 | ENEMA | RECTAL | Status: DC | PRN

## 2018-08-25 MED ORDER — DIPHENHYDRAMINE HCL 25 MG PO CAPS: 25.0000 mg | ORAL_CAPSULE | Freq: Four times a day (QID) | ORAL | Status: DC | PRN

## 2018-08-25 MED ORDER — OXYTOCIN BOLUS FROM INFUSION
500.0000 mL | Freq: Once | INTRAVENOUS | Status: AC
  Administered 2018-08-25: 500 mL via INTRAVENOUS

## 2018-08-25 MED ORDER — OXYCODONE-ACETAMINOPHEN 5-325 MG PO TABS: 2.0000 | ORAL_TABLET | ORAL | Status: DC | PRN

## 2018-08-25 MED ORDER — LACTATED RINGERS IV SOLN: INTRAVENOUS | Status: DC

## 2018-08-25 MED ORDER — BENZOCAINE-MENTHOL 20-0.5 % EX AERO
1.0000 "application " | INHALATION_SPRAY | CUTANEOUS | Status: DC | PRN
  Administered 2018-08-25: 1 via TOPICAL
  Filled 2018-08-25: qty 56

## 2018-08-25 MED ORDER — COCONUT OIL OIL: 1.0000 "application " | TOPICAL_OIL | Status: DC | PRN

## 2018-08-25 MED ORDER — OXYCODONE-ACETAMINOPHEN 5-325 MG PO TABS: 1.0000 | ORAL_TABLET | ORAL | Status: DC | PRN

## 2018-08-25 MED ORDER — ONDANSETRON HCL 4 MG/2ML IJ SOLN: 4.0000 mg | Freq: Four times a day (QID) | INTRAMUSCULAR | Status: DC | PRN

## 2018-08-25 MED ORDER — IBUPROFEN 600 MG PO TABS
600.0000 mg | ORAL_TABLET | Freq: Four times a day (QID) | ORAL | Status: DC
  Administered 2018-08-25 – 2018-08-26 (×5): 600 mg via ORAL
  Filled 2018-08-25 (×5): qty 1

## 2018-08-25 MED ORDER — LACTATED RINGERS IV SOLN: 500.0000 mL | INTRAVENOUS | Status: DC | PRN

## 2018-08-25 MED ORDER — MEASLES, MUMPS & RUBELLA VAC ~~LOC~~ INJ
0.5000 mL | INJECTION | Freq: Once | SUBCUTANEOUS | Status: DC
  Filled 2018-08-25: qty 0.5

## 2018-08-25 MED ORDER — SENNOSIDES-DOCUSATE SODIUM 8.6-50 MG PO TABS
2.0000 | ORAL_TABLET | ORAL | Status: DC
  Administered 2018-08-25: 2 via ORAL
  Filled 2018-08-25: qty 2

## 2018-08-25 MED ORDER — ACETAMINOPHEN 325 MG PO TABS: 650.0000 mg | ORAL_TABLET | ORAL | Status: DC | PRN

## 2018-08-25 MED ORDER — PRENATAL MULTIVITAMIN CH
1.0000 | ORAL_TABLET | Freq: Every day | ORAL | Status: DC
  Administered 2018-08-25: 1 via ORAL
  Filled 2018-08-25: qty 1

## 2018-08-25 MED ORDER — ONDANSETRON HCL 4 MG/2ML IJ SOLN: 4.0000 mg | INTRAMUSCULAR | Status: DC | PRN

## 2018-08-25 MED ORDER — SOD CITRATE-CITRIC ACID 500-334 MG/5ML PO SOLN: 30.0000 mL | ORAL | Status: DC | PRN

## 2018-08-25 MED ORDER — WITCH HAZEL-GLYCERIN EX PADS: 1.0000 "application " | MEDICATED_PAD | CUTANEOUS | Status: DC | PRN

## 2018-08-25 MED ORDER — ZOLPIDEM TARTRATE 5 MG PO TABS: 5.0000 mg | ORAL_TABLET | Freq: Every evening | ORAL | Status: DC | PRN

## 2018-08-25 MED ORDER — OXYTOCIN 40 UNITS IN LACTATED RINGERS INFUSION - SIMPLE MED
2.5000 [IU]/h | INTRAVENOUS | Status: DC
  Filled 2018-08-25: qty 1000

## 2018-08-25 MED ORDER — DIBUCAINE 1 % RE OINT: 1.0000 "application " | TOPICAL_OINTMENT | RECTAL | Status: DC | PRN

## 2018-08-25 MED ORDER — TETANUS-DIPHTH-ACELL PERTUSSIS 5-2.5-18.5 LF-MCG/0.5 IM SUSP: 0.5000 mL | Freq: Once | INTRAMUSCULAR | Status: DC

## 2018-08-25 MED ORDER — FENTANYL CITRATE (PF) 100 MCG/2ML IJ SOLN
100.0000 ug | INTRAMUSCULAR | Status: DC | PRN
  Administered 2018-08-25: 100 ug via INTRAVENOUS
  Filled 2018-08-25: qty 2

## 2018-08-25 MED ORDER — LIDOCAINE HCL (PF) 1 % IJ SOLN
30.0000 mL | INTRAMUSCULAR | Status: DC | PRN
  Administered 2018-08-25: 30 mL via SUBCUTANEOUS
  Filled 2018-08-25: qty 30

## 2018-08-25 MED ORDER — ONDANSETRON HCL 4 MG PO TABS: 4.0000 mg | ORAL_TABLET | ORAL | Status: DC | PRN

## 2018-08-25 MED ORDER — SIMETHICONE 80 MG PO CHEW: 80.0000 mg | CHEWABLE_TABLET | ORAL | Status: DC | PRN

## 2018-08-25 NOTE — H&P (Addendum)
OBSTETRIC ADMISSION HISTORY AND PHYSICAL  Monique Patrick is a 32 y.o. female G2P2002 with IUP at [redacted]w[redacted]d by LMP presenting for contractions. She reports +FMs, No LOF, no VB, no blurry vision, headaches or peripheral edema, and RUQ pain.  She plans on breast feeding. She is unsure what she wants to do for birth control. She received her prenatal care at [facility]   Dating: By LMP --->  Estimated Date of Delivery: 08/30/18  Sono:  [redacted]w[redacted]d, normal anatomy, cephalic presentation,  312g, 69% EFW   Prenatal History/Complications: GERD  Past Medical History: Past Medical History:  Diagnosis Date  . Allergy   . Chronic headaches   . GERD (gastroesophageal reflux disease)     Past Surgical History: History reviewed. No pertinent surgical history.  Obstetrical History: OB History    Gravida  3   Para  2   Term  2   Preterm      AB      Living  2     SAB      TAB      Ectopic      Multiple      Live Births  2           Social History: Social History   Socioeconomic History  . Marital status: Married    Spouse name: Not on file  . Number of children: 2  . Years of education: 58  . Highest education level: Not on file  Occupational History  . Not on file  Social Needs  . Financial resource strain: Not on file  . Food insecurity:    Worry: Not on file    Inability: Not on file  . Transportation needs:    Medical: Not on file    Non-medical: Not on file  Tobacco Use  . Smoking status: Never Smoker  . Smokeless tobacco: Never Used  Substance and Sexual Activity  . Alcohol use: No  . Drug use: No  . Sexual activity: Yes    Birth control/protection: None    Comment: desires Implanon  Lifestyle  . Physical activity:    Days per week: Not on file    Minutes per session: Not on file  . Stress: Not on file  Relationships  . Social connections:    Talks on phone: Not on file    Gets together: Not on file    Attends religious service: Not on file    Active  member of club or organization: Not on file    Attends meetings of clubs or organizations: Not on file    Relationship status: Not on file  Other Topics Concern  . Not on file  Social History Narrative  . Not on file    Family History: Family History  Problem Relation Age of Onset  . Diabetes Father   . Diabetes Maternal Grandmother   . Diabetes Paternal Grandfather     Allergies: No Known Allergies  Medications Prior to Admission  Medication Sig Dispense Refill Last Dose  . omeprazole (PRILOSEC) 40 MG capsule Take 1 capsule (40 mg total) by mouth daily. 30 capsule 3 Past Month at Unknown time  . prenatal vitamin w/FE, FA (PRENATAL 1 + 1) 27-1 MG TABS tablet Take 1 tablet by mouth daily at 12 noon. 30 each 12 08/24/2018 at Unknown time     Review of Systems   All systems reviewed and negative except as stated in HPI  Temperature 98.5 F (36.9 C), temperature source Oral, resp. rate  18, weight 86.6 kg, last menstrual period 11/23/2017, currently breastfeeding. General appearance: alert, cooperative, appears stated age and mild distress Lungs: clear to auscultation bilaterally Heart: regular rate and rhythm Abdomen: soft, non-tender; bowel sounds normal Pelvic: see below Extremities: Homans sign is negative, no sign of DVT Presentation: cephalic Fetal monitoringBaseline: 120 bpm, Variability: Good {> 6 bpm), Accelerations: Reactive and Decelerations: Absent Uterine activityFrequency: Every 4-5 minutes Dilation: 5 Effacement (%): 90 Station: -2 Exam by:: B Mosca RN   Prenatal labs: ABO, Rh: O/Positive/-- (03/20 0946) Antibody: Negative (03/20 0946) Rubella: 26.50 (03/20 0946) RPR: Non Reactive (07/10 0848)  HBsAg: Negative (03/20 0946)  HIV: Non Reactive (07/10 0848)  GBS:    2 hr Glucola normal Genetic screening  declined Anatomy US normal girl  Prenatal Transfer Tool  Maternal Diabetes: No Genetic Screening: Declined Maternal Ultrasounds/Referrals:  Normal Fetal Ultrasounds or other Referrals:  None Maternal Substance Abuse:  No Significant Maternal Medications:  None Significant Maternal Lab Results: None  Results for orders placed or performed during the hospital encounter of 08/25/18 (from the past 24 hour(s))  CBC   Collection Time: 08/25/18  3:57 AM  Result Value Ref Range   WBC 10.0 4.0 - 10.5 K/uL   RBC 4.24 3.87 - 5.11 MIL/uL   Hemoglobin 12.3 12.0 - 15.0 g/dL   HCT 16.1 09.6 - 04.5 %   MCV 89.6 78.0 - 100.0 fL   MCH 29.0 26.0 - 34.0 pg   MCHC 32.4 30.0 - 36.0 g/dL   RDW 40.9 81.1 - 91.4 %   Platelets 250 150 - 400 K/uL    Patient Active Problem List   Diagnosis Date Noted  . Indication for care in labor or delivery 08/25/2018  . Encounter for supervision of low-risk pregnancy 02/13/2018  . Tension headache 01/22/2017  . Gastroesophageal reflux disease with esophagitis 01/22/2017  . Hypotension 03/24/2015  . Urticaria 10/30/2012    Assessment/Plan:  Monique Patrick is a 32 y.o. G3P2002 at [redacted]w[redacted]d here for SOL  #Labor:expectant management #Pain: Would like to avoid epidural #FWB: Category I #ID:  GBS negative #MOF: Breast #MOC:unsure #Circ:  N/A, girl  Monique Mo, MD  08/25/2018, 4:28 AM  OB FELLOW HISTORY AND PHYSICAL ATTESTATION  I have seen and examined this patient; I agree with above documentation in the resident's note.   Monique Patrick, D.O. OB Fellow  08/25/2018, 7:12 AM

## 2018-08-25 NOTE — MAU Note (Signed)
Pt reports UC's q 2-3 minutes

## 2018-08-26 ENCOUNTER — Encounter (HOSPITAL_COMMUNITY): Payer: Self-pay | Admitting: *Deleted

## 2018-08-26 MED ORDER — IBUPROFEN 600 MG PO TABS: 600.0000 mg | ORAL_TABLET | Freq: Three times a day (TID) | ORAL | 0 refills | Status: DC

## 2018-08-26 NOTE — Progress Notes (Signed)
Post Partum Day 1 Subjective: Monique Patrick is a 32 year old G3P3 female who is PPD#1 after spontaneous vaginal delivery at [redacted]w[redacted]d. She reports that she is doing "okay". She reports that she has been up out of bed and is able to eat without nausea. She reports that her bleeding is lighter than last night. Patient denies bowel movement or flatus. Denies headache, RUQ pain, or vision changes. She is currently breast and bottle feeding. She states she is still thinking about birth control.   Objective: Blood pressure (!) 96/50, pulse 68, temperature 97.8 F (36.6 C), temperature source Oral, resp. rate 20, weight 86.6 kg, last menstrual period 11/23/2017, SpO2 98 %, currently breastfeeding.  Physical Exam:  General: alert and cooperative  Heart: Regular rate and rhythm, no murmurs noted. Lungs: Clear to auscultation, no wheezes noted.  Lochia: appropriate Uterine Fundus: firm, palpable DVT Evaluation: Negative Homan's sign. No tenderness to palpation of bilateral calf/ankle. No significant calf/ankle edema.  Recent Labs    08/25/18 0357  HGB 12.3  HCT 38.0    Assessment/Plan: Assessment: Monique Patrick is PPD#1 after spontaneous vaginal delivery. She is tolerating ambulation well. Plan: Continue to monitor for increased vaginal bleeding or pain. Follow-up after next meal to determine if she has had a bowel movement or is passing flatus.    LOS: 1 day   Charyl Dancer 08/26/2018, 7:26 AM

## 2018-08-26 NOTE — Discharge Summary (Signed)
Obstetrics Discharge Summary OB/GYN [facility]   Patient Name: Monique Patrick DOB: 31-Mar-1986 MRN: [id number]  Date of admission: 08/25/2018 Delivering MD: Arvilla Market   Date of discharge: 08/26/2018  Admitting diagnosis: 39wks ctx 2 mins apart Intrauterine pregnancy: [redacted]w[redacted]d     Secondary diagnosis:   Active Problems:   Tension headache   Gastroesophageal reflux disease with esophagitis   Indication for care in labor or delivery    Discharge diagnosis: Term Pregnancy Delivered                               Postpartum procedures: none Complications: 2nd degree perineal laceration, repaired  Hospital course: Monique Patrick is a 32 y.o. [redacted]w[redacted]d who was admitted for spontaneous onset of labor. Her pregnancy was complicated by GERD. Her labor course was notable for AROM clear fluid. Delivery was complicated by 2nd degree laceration. Please see delivery/op note for additional details. Her postpartum course was uncomplicated. She was breastfeeding without difficulty. By day of discharge, she was passing flatus, urinating, eating and drinking without difficulty. Her pain was well-controlled, and she was discharged home with ibuprofen. She will follow-up in clinic in 4-6 weeks.   Physical exam  Vitals:   08/25/18 1340 08/25/18 1700 08/25/18 2130 08/26/18 0702  BP: (!) 102/59 105/64 (!) 115/55 (!) 96/50  Pulse: 69 69 61 68  Resp: 17  20   Temp: 97.8 F (36.6 C)  98 F (36.7 C) 97.8 F (36.6 C)  TempSrc: Axillary  Oral Oral  SpO2:  100% 100% 98%  Weight:       General: well-appearing, NAD Lochia: appropriate Uterine Fundus: firm Incision: N/A DVT Evaluation: No evidence of DVT seen on physical exam. Labs: Lab Results  Component Value Date   WBC 10.0 08/25/2018   HGB 12.3 08/25/2018   HCT 38.0 08/25/2018   MCV 89.6 08/25/2018   PLT 250 08/25/2018   CMP Latest Ref Rng & Units 07/23/2015  Glucose 65 - 99 mg/dL 79  BUN 7 - 25 mg/dL 9  Creatinine 4.09 - 8.11 mg/dL  9.14  Sodium 782 - 956 mmol/L 137  Potassium 3.5 - 5.3 mmol/L 4.2  Chloride 98 - 110 mmol/L 100  CO2 20 - 31 mmol/L 26  Calcium 8.6 - 10.2 mg/dL 8.9  Total Protein 6.1 - 8.1 g/dL 7.5  Total Bilirubin 0.2 - 1.2 mg/dL 0.4  Alkaline Phos 33 - 115 U/L 60  AST 10 - 30 U/L 32(H)  ALT 6 - 29 U/L 39(H)    Discharge instructions: Per After Visit Summary and "Baby and Me Booklet"  After visit meds:  Allergies as of 08/26/2018   No Known Allergies     Medication List    TAKE these medications   ibuprofen 600 MG tablet Commonly known as:  ADVIL,MOTRIN Take 1 tablet (600 mg total) by mouth every 8 (eight) hours.   omeprazole 40 MG capsule Commonly known as:  PRILOSEC Take 1 capsule (40 mg total) by mouth daily.   prenatal vitamin w/FE, FA 27-1 MG Tabs tablet Take 1 tablet by mouth daily at 12 noon.       Postpartum contraception: Natural Family Planning Diet: Routine Diet Activity: Advance as tolerated. Pelvic rest for 6 weeks.   Outpatient follow up: 4-6 weeks  Newborn Data: Live born female  Birth Weight: 7 lb 9 oz (3430 g) APGAR: 9, 9  Newborn Delivery   Birth date/time:  08/25/2018 06:47:00 Delivery type:  Vaginal, Spontaneous    Baby Feeding: Breast Disposition:home with mother  Cristal Deer. Earlene Plater, DO OB/GYN Fellow, Faculty Practice

## 2018-08-26 NOTE — Lactation Note (Signed)
This note was copied from a baby's chart. Lactation Consultation Note  Patient Name: Monique Patrick [id number] Date: 08/26/2018 Reason for consult: Follow-up assessment;Nipple pain/trauma  Visited with P3 Mom of term baby at 57 hrs old.  Baby at 3% weight loss.  Mom has been breastfeeding and offering some formula per Mother's choice. Mom states baby is latching fine, but does state her nipples are sore.  Offered to help and assess baby latching, but declined.  Comfort Gels given, and hand pump.   Reviewed importance of keeping baby STS, and feeding her often on cue, goal of >8 feedings per 24 hrs. Mom aware of OP lactation support available to her.  Encouraged her to call prn.  Consult Status Consult Status: Complete    [name] 08/26/2018, 10:34 AM

## 2018-08-29 ENCOUNTER — Encounter: Payer: 59 | Admitting: Obstetrics and Gynecology

## 2018-09-04 ENCOUNTER — Encounter (HOSPITAL_COMMUNITY): Payer: Self-pay | Admitting: *Deleted

## 2018-10-15 ENCOUNTER — Ambulatory Visit (INDEPENDENT_AMBULATORY_CARE_PROVIDER_SITE_OTHER): Payer: 59 | Admitting: Advanced Practice Midwife

## 2018-10-15 NOTE — Progress Notes (Signed)
Subjective:     Genia Hotterhssan Folker is a 32 y.o. female who presents for a postpartum visit. She is 7 weeks postpartum following a spontaneous vaginal delivery. I have fully reviewed the prenatal and intrapartum course. The delivery was at 3540w3d gestational weeks. Outcome: spontaneous vaginal delivery. Anesthesia: none. Postpartum course has been unremarkable. Baby's course has been unremarkable . Baby is feeding by breast and bottle - Enfamil with Iron. Bleeding no bleeding. Has resumed menses. Bowel function is normal. Bladder function is normal. Patient is sexually active. Contraception method is condoms. Postpartum depression screening: negative.  The following portions of the patient's history were reviewed and updated as appropriate: allergies, current medications, past family history, past medical history, past social history, past surgical history and problem list.  Review of Systems Pertinent items are noted in HPI.  Feels like something is sticking out where she had her perineal laceration repaired. Denies pain.   Objective:    BP (!) 105/52   Pulse 72   Wt 170 lb 14.4 oz (77.5 kg)   LMP 10/13/2018   Breastfeeding? Yes   BMI 27.17 kg/m   General:  alert, cooperative, appears stated age and no distress   Breasts:  declined  Lungs: clear to auscultation bilaterally  Heart:  regular rate and rhythm, S1, S2 normal, no murmur, click, rub or gallop  Abdomen: soft, non-tender; bowel sounds normal; no masses,  no organomegaly   Vulva:  normal. Tiny hymenal ring remnants at introitus. Well-healed perineal laceration.   Vagina: normal vagina, no discharge, exudate, lesion, or erythema  Cervix:  not evaluated  Corpus: not examined  Adnexa:  not evaluated  Rectal Exam: Not performed.        Assessment:     Normal postpartum exam. Pap smear not done at today's visit.   Plan:    1. Contraception: condoms 2. Contraceptive options discussed 3. Follow up in: 1 year or as needed.

## 2018-10-15 NOTE — Patient Instructions (Signed)

## 2018-10-16 LAB — CBC
HEMOGLOBIN: 12.7 g/dL (ref 11.1–15.9)
Hematocrit: 38.1 % (ref 34.0–46.6)
MCH: 28.1 pg (ref 26.6–33.0)
MCHC: 33.3 g/dL (ref 31.5–35.7)
MCV: 84 fL (ref 79–97)
Platelets: 347 10*3/uL (ref 150–450)
RBC: 4.52 x10E6/uL (ref 3.77–5.28)
RDW: 13.7 % (ref 12.3–15.4)
WBC: 5.4 10*3/uL (ref 3.4–10.8)

## 2018-10-17 ENCOUNTER — Encounter: Payer: Self-pay | Admitting: Advanced Practice Midwife

## 2023-08-17 ENCOUNTER — Encounter: Payer: Self-pay | Admitting: Family Medicine

## 2023-08-17 ENCOUNTER — Ambulatory Visit (INDEPENDENT_AMBULATORY_CARE_PROVIDER_SITE_OTHER): Payer: Medicaid Other | Admitting: Family Medicine

## 2023-08-17 ENCOUNTER — Other Ambulatory Visit: Payer: Self-pay

## 2023-08-17 VITALS — BP 102/42 | HR 80 | Ht 66.0 in | Wt 174.6 lb

## 2023-08-17 DIAGNOSIS — Z3201 Encounter for pregnancy test, result positive: Secondary | ICD-10-CM

## 2023-08-17 LAB — POCT PREGNANCY, URINE: Preg Test, Ur: POSITIVE — AB

## 2023-08-17 MED ORDER — PRENATAL 27-1 MG PO TABS: 1.0000 | ORAL_TABLET | Freq: Every day | ORAL | 11 refills | Status: AC

## 2023-08-17 NOTE — Progress Notes (Signed)
Here for pregnancy confirmation. UPT positive. She reports sure LMP 05/05/23 of a normal period. This makes her [redacted]w[redacted]d with EDD 02/09/24. This is her 4 th pregnancy. She confirms 3 normal pregnancies with vaginal deliveries. She denies any health issues. She request PNV. I advised start prenatal care with provider of her choice and she is unsure where she will go, maybe here or other location or other  provider. Will call if she desires appointment here . I explained we recommend dating [place] and she agreed to an appointment. Dr. Crissie Reese in to welcome to practice. Nancy Fetter

## 2023-08-17 NOTE — Progress Notes (Signed)
  History:  [name] is a 37 y.o. [id number] who presents to clinic today with complaint of possible pregnancy.   Positive UPT in clinic Trying for a boy Sure LMP  Past Medical History:  Diagnosis Date   Allergy    Chronic headaches    GERD (gastroesophageal reflux disease)     No past surgical history on file.  The following portions of the patient's history were reviewed and updated as appropriate: allergies, current medications, past family history, past medical history, past social history, past surgical history and problem list.   Review of Systems:  Pertinent items noted in HPI and remainder of comprehensive ROS otherwise negative.  Objective:  Physical Exam BP (!) 102/42   Pulse 80   Ht 5\' 6"  (1.676 m)   Wt 174 lb 9.6 oz (79.2 kg)   BMI 28.18 kg/m  Physical Exam Vitals reviewed.  Constitutional:      General: She is not in acute distress.    Appearance: She is well-developed. She is not diaphoretic.  Eyes:     General: No scleral icterus. Pulmonary:     Effort: Pulmonary effort is normal. No respiratory distress.  Skin:    General: Skin is warm and dry.  Neurological:     Mental Status: She is alert.     Coordination: Coordination normal.      Labs and Imaging Results for orders placed or performed in visit on 08/17/23 (from the past 24 hour(s))  Pregnancy, urine POC     Status: Abnormal   Collection Time: 08/17/23  8:37 AM  Result Value Ref Range   Preg Test, Ur POSITIVE (A) NEGATIVE    No results found.   Assessment & Plan:  1. Positive pregnancy test Congratulated, given list of prenatal providers Start prenatals Dating [place] scheduled - Prenatal 27-1 MG TABS; Take 1 tablet by mouth daily.  Dispense: 30 tablet; Refill: 11 - US OB LESS THAN 14 WEEKS WITH OB TRANSVAGINAL; Future    Approximately 15 minutes of total time was spent with this patient on history taking, coordination of care, education and documentation.   Venora Maples, MD 08/17/2023 10:00 AM

## 2023-08-17 NOTE — Patient Instructions (Signed)
Prenatal Care Providers           Center for [facility] @ [facility]  [address] [phone]  Center for [facility] @ [facility]   [address]  [phone]  [facility] @ [facility]       [address] [phone]            Center for [facility] @ [place]     [phone]          Center for [facility] @ [facility]   [address] [phone]  [facility] for [facility] @ [facility]  [address] [phone]     Center for [facility] @ [address] [place])  [address]   [phone]     [facility]  Phone: [phone]  [facility]  Phone: [phone]  [facility] Phone: [phone]  [facility] Phone: [phone]  [facility] Phone: [phone]  [facility] Phone: [phone]  [facility]  Phone: [phone]

## 2023-08-27 ENCOUNTER — Other Ambulatory Visit: Payer: Self-pay

## 2023-08-27 ENCOUNTER — Ambulatory Visit (INDEPENDENT_AMBULATORY_CARE_PROVIDER_SITE_OTHER): Payer: Medicaid Other

## 2023-08-27 DIAGNOSIS — Z3687 Encounter for antenatal screening for uncertain dates: Secondary | ICD-10-CM

## 2023-08-27 DIAGNOSIS — Z3A17 17 weeks gestation of pregnancy: Secondary | ICD-10-CM | POA: Diagnosis not present

## 2023-08-27 DIAGNOSIS — Z3492 Encounter for supervision of normal pregnancy, unspecified, second trimester: Secondary | ICD-10-CM | POA: Diagnosis not present

## 2023-08-27 DIAGNOSIS — Z3201 Encounter for pregnancy test, result positive: Secondary | ICD-10-CM | POA: Diagnosis not present

## 2023-09-14 LAB — OB RESULTS CONSOLE RUBELLA ANTIBODY, IGM: Rubella: IMMUNE

## 2023-09-14 LAB — OB RESULTS CONSOLE HIV ANTIBODY (ROUTINE TESTING): HIV: NONREACTIVE

## 2023-09-14 LAB — OB RESULTS CONSOLE RPR: RPR: NONREACTIVE

## 2023-09-14 LAB — OB RESULTS CONSOLE ANTIBODY SCREEN: Antibody Screen: NEGATIVE

## 2023-09-14 LAB — OB RESULTS CONSOLE HEPATITIS B SURFACE ANTIGEN: Hepatitis B Surface Ag: NEGATIVE

## 2023-09-14 LAB — HEPATITIS C ANTIBODY: HCV Ab: NEGATIVE

## 2023-11-10 ENCOUNTER — Encounter (HOSPITAL_COMMUNITY): Payer: Self-pay

## 2023-11-10 ENCOUNTER — Other Ambulatory Visit: Payer: Self-pay

## 2023-11-10 ENCOUNTER — Inpatient Hospital Stay (HOSPITAL_COMMUNITY)
Admission: AD | Admit: 2023-11-10 | Discharge: 2023-11-10 | Disposition: A | Discharge status: Home / Self Care | Payer: Medicaid Other | Attending: Obstetrics and Gynecology | Admitting: Obstetrics and Gynecology

## 2023-11-10 DIAGNOSIS — R1030 Lower abdominal pain, unspecified: Secondary | ICD-10-CM | POA: Diagnosis present

## 2023-11-10 DIAGNOSIS — R109 Unspecified abdominal pain: Secondary | ICD-10-CM

## 2023-11-10 DIAGNOSIS — O26899 Other specified pregnancy related conditions, unspecified trimester: Secondary | ICD-10-CM

## 2023-11-10 DIAGNOSIS — O26892 Other specified pregnancy related conditions, second trimester: Secondary | ICD-10-CM | POA: Diagnosis not present

## 2023-11-10 DIAGNOSIS — N841 Polyp of cervix uteri: Secondary | ICD-10-CM | POA: Diagnosis not present

## 2023-11-10 DIAGNOSIS — N930 Postcoital and contact bleeding: Secondary | ICD-10-CM | POA: Insufficient documentation

## 2023-11-10 DIAGNOSIS — Z3A27 27 weeks gestation of pregnancy: Secondary | ICD-10-CM | POA: Diagnosis not present

## 2023-11-10 DIAGNOSIS — O3442 Maternal care for other abnormalities of cervix, second trimester: Secondary | ICD-10-CM | POA: Insufficient documentation

## 2023-11-10 DIAGNOSIS — N898 Other specified noninflammatory disorders of vagina: Secondary | ICD-10-CM | POA: Diagnosis present

## 2023-11-10 LAB — URINALYSIS, ROUTINE W REFLEX MICROSCOPIC
Bilirubin Urine: NEGATIVE
Glucose, UA: NEGATIVE mg/dL
Hgb urine dipstick: NEGATIVE
Ketones, ur: 20 mg/dL — AB
Leukocytes,Ua: NEGATIVE
Nitrite: NEGATIVE
Protein, ur: NEGATIVE mg/dL
Specific Gravity, Urine: 1.018 (ref 1.005–1.030)
pH: 7 (ref 5.0–8.0)

## 2023-11-10 LAB — WET PREP, GENITAL
Sperm: NONE SEEN
Trich, Wet Prep: NONE SEEN
WBC, Wet Prep HPF POC: >=10 — AB (ref <10)
Yeast Wet Prep HPF POC: NONE SEEN

## 2023-11-10 NOTE — MAU Note (Signed)
Monique Patrick is a 37 y.o. at [redacted]w[redacted]d here in MAU reporting: irregular abdominal cramping that started this am. Reports light brown mucus discharge that started last night and has continued into today. Reports sexual intercourse last night. Denies any urinary symptoms. Denies any vaginal odor, itching or pain. Endorses +FM  LMP: n/a Onset of complaint: last night  Pain score: 2 abdominal  Vitals:   11/10/23 1159  BP: (!) 113/50  Pulse: 84  Resp: 16     FHT:165 Lab orders placed from triage:

## 2023-11-10 NOTE — MAU Provider Note (Signed)
History     CSN: [id number]  Arrival date and time: 11/10/23 1135   Event Date/Time   First Provider Initiated Contact with Patient 11/10/23 1235      Chief Complaint  Patient presents with   Vaginal Discharge   Abdominal Pain   HPI Monique Patrick is a 37 y.o. G4P3003 at [redacted]w[redacted]d who presents for abdominal cramping & vaginal discharge. Symptoms started last night after intercourse. Reports pink tinged mucoid discharge that is now brown in color. Denies LOF, vaginal irritation, or malodorous discharge. Intermittent lower abdominal cramping. Can't tell frequency. Denies n/v/d, dysuria. Reports good fetal movement. Goes to [employer]. Denies complications with the pregnancy.  Hasn't had anything to drink or eat today.   OB History     Gravida  4   Para  3   Term  3   Preterm      AB      Living  3      SAB      IAB      Ectopic      Multiple      Live Births  3           Past Medical History:  Diagnosis Date   Allergy    Chronic headaches    GERD (gastroesophageal reflux disease)     History reviewed. No pertinent surgical history.  Family History  Problem Relation Age of Onset   Diabetes Father    Diabetes Maternal Grandmother    Diabetes Paternal Grandfather     Social History   Tobacco Use   Smoking status: Never   Smokeless tobacco: Never  Vaping Use   Vaping status: Never Used  Substance Use Topics   Alcohol use: No   Drug use: No    Allergies: No Known Allergies  Medications Prior to Admission  Medication Sig Dispense Refill Last Dose/Taking   cholecalciferol (VITAMIN D3) 25 MCG (1000 UNIT) tablet Take 1,000 Units by mouth daily.   11/10/2023   Prenatal Vit-Fe Fumarate-FA (PRENATAL VITAMIN PO) Take 1 tablet by mouth daily at 6 (six) AM.   11/10/2023   ibuprofen (ADVIL,MOTRIN) 600 MG tablet Take 1 tablet (600 mg total) by mouth every 8 (eight) hours. 30 tablet 0    omeprazole (PRILOSEC) 40 MG capsule Take 1 capsule (40 mg total) by mouth  daily. 30 capsule 3    Prenatal 27-1 MG TABS Take 1 tablet by mouth daily. 30 tablet 11    prenatal vitamin w/FE, FA (PRENATAL 1 + 1) 27-1 MG TABS tablet Take 1 tablet by mouth daily at 12 noon. 30 each 12     Review of Systems  All other systems reviewed and are negative.  Physical Exam   Blood pressure (!) 113/50, pulse 84, resp. rate 16, height 5\' 6"  (1.676 m), weight 86.2 kg, currently breastfeeding.  Physical Exam Vitals and nursing note reviewed. Exam conducted with a chaperone present.  Constitutional:      General: She is not in acute distress.    Appearance: She is well-developed. She is not ill-appearing.  HENT:     Head: Normocephalic and atraumatic.  Eyes:     General: No scleral icterus.       Right eye: No discharge.        Left eye: No discharge.     Conjunctiva/sclera: Conjunctivae normal.  Pulmonary:     Effort: Pulmonary effort is normal. No respiratory distress.  Abdominal:     Palpations: Abdomen is soft.  Tenderness: There is no abdominal tenderness.     Comments: Gravid  Genitourinary:    Comments: Spec exam: NEFG, scant clear mucoid discharge. Polyp in os <5 mm. No bleeding. No pooling.   Dilation: Closed Exam by:: Estanislado Spire NP  Neurological:     General: No focal deficit present.     Mental Status: She is alert.  Psychiatric:        Mood and Affect: Mood normal.        Behavior: Behavior normal.    NST:  Baseline: 145 bpm, Variability: Good {> 6 bpm), Accelerations: Non-reactive but appropriate for gestational age, and Decelerations: Absent  MAU Course  Procedures Results for orders placed or performed during the hospital encounter of 11/10/23 (from the past 24 hours)  Urinalysis, Routine w reflex microscopic -Urine, Clean Catch     Status: Abnormal   Collection Time: 11/10/23 11:35 AM  Result Value Ref Range   Color, Urine YELLOW YELLOW   APPearance CLEAR CLEAR   Specific Gravity, Urine 1.018 1.005 - 1.030   pH 7.0 5.0 - 8.0    Glucose, UA NEGATIVE NEGATIVE mg/dL   Hgb urine dipstick NEGATIVE NEGATIVE   Bilirubin Urine NEGATIVE NEGATIVE   Ketones, ur 20 (A) NEGATIVE mg/dL   Protein, ur NEGATIVE NEGATIVE mg/dL   Nitrite NEGATIVE NEGATIVE   Leukocytes,Ua NEGATIVE NEGATIVE  Wet prep, genital     Status: Abnormal   Collection Time: 11/10/23 12:48 PM   Specimen: Cervix  Result Value Ref Range   Yeast Wet Prep HPF POC NONE SEEN NONE SEEN   Trich, Wet Prep NONE SEEN NONE SEEN   Clue Cells Wet Prep HPF POC PRESENT (A) NONE SEEN   WBC, Wet Prep HPF POC >=10 (A) <10   Sperm NONE SEEN    No results found.  MDM No abnormal discharge on exam. No blood. Does have a very small cervical polyp.  Cervix is closed/thick Initially had ctx on toco every 3-4 minutes. Treated with oral fluids & they spaced out. Patient reports resolution of cramping. Cervix unchanged.   Wet prep positive for clue cells. No abnormal discharge on exam. Doesn't meet amsel criteria for BV   Assessment and Plan   1. Postcoital bleeding  -RH positive -No active bleeding on exam. Likely related to cervical polyp in setting of recent intercourse -Reviewed bleeding precautions  2. Cervical polyp   3. Abdominal cramping affecting pregnancy  -Cramping improved with oral hydration. Cervix closed.  -Reviewed PTL precautions  4. [redacted] weeks gestation of pregnancy      Monique Patrick 11/10/2023, 12:36 PM

## 2023-11-28 NOTE — L&D Delivery Note (Signed)
 Delivery Note Labor onset: 01/25/2024 Labor Onset Time: 1431 Complete dilation at 4:31 PM Onset of pushing at 1706 FHR second stage Cat II Analgesia/Anesthesia intrapartum: epidural  Guided pushing with maternal urge. Delivery of a viable female at 1726. Fetal head delivered in ROA position.  Nuchal cord: x1 reduce.  Infant placed on maternal abd, dried, and tactile stim.  Cord double clamped after 2 min and cut by father, [name].  RN x2 present for birth.  Cord blood sample collected: yes Arterial cord blood sample collected: no  Placenta delivered Schultz side, intact, with 3 VC.  Placenta to L&D  Uterine tone firm, bleeding scant  First degree laceration identified.  Anesthesia: epdiral Repair: 3-0 Vicryl CT  QBL (mL): 251 Complications: none APGAR: APGAR (1 MIN): 8  APGAR (5 MINS): 9  APGAR (10 MINS):   Mom to postpartum.  Baby to Couplet care / Skin to Skin. Baby boy "Monique Patrick" Circumcision Yes  Roma Schanz DNP, CNM 01/25/2024, 11:26 PM

## 2024-01-16 ENCOUNTER — Encounter: Payer: Medicaid Other | Attending: Obstetrics and Gynecology | Admitting: Dietician

## 2024-01-16 DIAGNOSIS — O24419 Gestational diabetes mellitus in pregnancy, unspecified control: Secondary | ICD-10-CM | POA: Insufficient documentation

## 2024-01-16 NOTE — Progress Notes (Signed)
 Patient was seen on 01/16/2024 for Gestational Diabetes self-management class at the [facility]. The following learning objectives were met by the patient during this course:  States the definition of Gestational Diabetes States why dietary management is important in controlling blood glucose Describes the effects each nutrient has on blood glucose levels Demonstrates ability to create a balanced meal plan Demonstrates carbohydrate counting  States when to check blood glucose levels Demonstrates proper blood glucose monitoring techniques States the effect of stress and exercise on blood glucose levels States the importance of limiting caffeine and abstaining from alcohol and smoking  Blood glucose monitor given: Accu Chek Guide me Lot # [id number] Exp: 10/04/2024 Blood glucose reading: 86 mg/dL, reportes as 3 hour post prandial  Patient instructed to monitor glucose levels: QID FBS: 60 - <90 1 hour: <140 2 hour: <120  *Patient received handouts: Nutrition Diabetes and Pregnancy Carbohydrate Counting List Blood glucose log Snack ideas for diabetes during pregnancy  Patient will be seen for follow-up as needed.

## 2024-01-17 LAB — OB RESULTS CONSOLE GBS: GBS: NEGATIVE

## 2024-01-21 ENCOUNTER — Telehealth (HOSPITAL_COMMUNITY): Payer: Self-pay | Admitting: *Deleted

## 2024-01-21 ENCOUNTER — Encounter (HOSPITAL_COMMUNITY): Payer: Self-pay | Admitting: *Deleted

## 2024-01-21 ENCOUNTER — Other Ambulatory Visit: Payer: Self-pay | Admitting: Obstetrics and Gynecology

## 2024-01-21 NOTE — Telephone Encounter (Signed)
 Preadmission screen

## 2024-01-25 ENCOUNTER — Inpatient Hospital Stay (HOSPITAL_COMMUNITY)
Admission: RE | Admit: 2024-01-25 | Discharge: 2024-01-26 | DRG: 807 | Disposition: A | Discharge status: Home / Self Care | Payer: Medicaid Other | Attending: Obstetrics and Gynecology | Admitting: Obstetrics and Gynecology

## 2024-01-25 ENCOUNTER — Encounter (HOSPITAL_COMMUNITY): Payer: Self-pay | Admitting: Obstetrics and Gynecology

## 2024-01-25 ENCOUNTER — Inpatient Hospital Stay (HOSPITAL_COMMUNITY): Payer: Medicaid Other | Admitting: Anesthesiology

## 2024-01-25 ENCOUNTER — Other Ambulatory Visit: Payer: Self-pay

## 2024-01-25 ENCOUNTER — Inpatient Hospital Stay (HOSPITAL_COMMUNITY): Payer: Medicaid Other

## 2024-01-25 DIAGNOSIS — O9962 Diseases of the digestive system complicating childbirth: Secondary | ICD-10-CM | POA: Diagnosis present

## 2024-01-25 DIAGNOSIS — O99214 Obesity complicating childbirth: Secondary | ICD-10-CM | POA: Diagnosis present

## 2024-01-25 DIAGNOSIS — O09523 Supervision of elderly multigravida, third trimester: Secondary | ICD-10-CM | POA: Diagnosis present

## 2024-01-25 DIAGNOSIS — Z833 Family history of diabetes mellitus: Secondary | ICD-10-CM

## 2024-01-25 DIAGNOSIS — O9902 Anemia complicating childbirth: Secondary | ICD-10-CM | POA: Diagnosis present

## 2024-01-25 DIAGNOSIS — K219 Gastro-esophageal reflux disease without esophagitis: Secondary | ICD-10-CM | POA: Diagnosis present

## 2024-01-25 DIAGNOSIS — O24419 Gestational diabetes mellitus in pregnancy, unspecified control: Principal | ICD-10-CM | POA: Diagnosis present

## 2024-01-25 DIAGNOSIS — O24420 Gestational diabetes mellitus in childbirth, diet controlled: Secondary | ICD-10-CM | POA: Diagnosis present

## 2024-01-25 DIAGNOSIS — Z3A37 37 weeks gestation of pregnancy: Secondary | ICD-10-CM

## 2024-01-25 LAB — CBC
HCT: 37.1 % (ref 36.0–46.0)
Hemoglobin: 12.2 g/dL (ref 12.0–15.0)
MCH: 29.5 pg (ref 26.0–34.0)
MCHC: 32.9 g/dL (ref 30.0–36.0)
MCV: 89.6 fL (ref 80.0–100.0)
Platelets: 244 10*3/uL (ref 150–400)
RBC: 4.14 MIL/uL (ref 3.87–5.11)
RDW: 14.6 % (ref 11.5–15.5)
WBC: 5.8 10*3/uL (ref 4.0–10.5)
nRBC: 0 % (ref 0.0–0.2)

## 2024-01-25 LAB — RPR: RPR Ser Ql: NONREACTIVE

## 2024-01-25 LAB — GLUCOSE, CAPILLARY
Glucose-Capillary: 87 mg/dL (ref 70–99)
Glucose-Capillary: 95 mg/dL (ref 70–99)
Glucose-Capillary: 61 mg/dL — ABNORMAL LOW (ref 70–99)
Glucose-Capillary: 73 mg/dL (ref 70–99)

## 2024-01-25 LAB — TYPE AND SCREEN
ABO/RH(D): O POS
Antibody Screen: NEGATIVE

## 2024-01-25 MED ORDER — TERBUTALINE SULFATE 1 MG/ML IJ SOLN: 0.2500 mg | Freq: Once | INTRAMUSCULAR | Status: DC | PRN

## 2024-01-25 MED ORDER — ONDANSETRON HCL 4 MG/2ML IJ SOLN: 4.0000 mg | Freq: Four times a day (QID) | INTRAMUSCULAR | Status: DC | PRN

## 2024-01-25 MED ORDER — PRENATAL MULTIVITAMIN CH
1.0000 | ORAL_TABLET | Freq: Every day | ORAL | Status: DC
  Administered 2024-01-26: 1 via ORAL
  Filled 2024-01-25: qty 1

## 2024-01-25 MED ORDER — OXYTOCIN BOLUS FROM INFUSION
333.0000 mL | Freq: Once | INTRAVENOUS | Status: AC
  Administered 2024-01-25: 333 mL via INTRAVENOUS

## 2024-01-25 MED ORDER — EPHEDRINE 5 MG/ML INJ: 10.0000 mg | INTRAVENOUS | Status: DC | PRN

## 2024-01-25 MED ORDER — WITCH HAZEL-GLYCERIN EX PADS: 1.0000 | MEDICATED_PAD | CUTANEOUS | Status: DC | PRN

## 2024-01-25 MED ORDER — ONDANSETRON HCL 4 MG PO TABS: 4.0000 mg | ORAL_TABLET | ORAL | Status: DC | PRN

## 2024-01-25 MED ORDER — PHENYLEPHRINE 80 MCG/ML (10ML) SYRINGE FOR IV PUSH (FOR BLOOD PRESSURE SUPPORT): 80.0000 ug | PREFILLED_SYRINGE | INTRAVENOUS | Status: DC | PRN

## 2024-01-25 MED ORDER — SENNOSIDES-DOCUSATE SODIUM 8.6-50 MG PO TABS
2.0000 | ORAL_TABLET | ORAL | Status: DC
Start: 2024-01-26 — End: 2024-01-27
  Administered 2024-01-26: 2 via ORAL
  Filled 2024-01-25: qty 2

## 2024-01-25 MED ORDER — LACTATED RINGERS IV SOLN
500.0000 mL | Freq: Once | INTRAVENOUS | Status: AC
  Administered 2024-01-25: 500 mL via INTRAVENOUS

## 2024-01-25 MED ORDER — LIDOCAINE HCL (PF) 1 % IJ SOLN
INTRAMUSCULAR | Status: DC | PRN
  Administered 2024-01-25: 8 mL via EPIDURAL

## 2024-01-25 MED ORDER — FENTANYL-BUPIVACAINE-NACL 0.5-0.125-0.9 MG/250ML-% EP SOLN
12.0000 mL/h | EPIDURAL | Status: DC | PRN
  Administered 2024-01-25: 12 mL/h via EPIDURAL
  Filled 2024-01-25: qty 250

## 2024-01-25 MED ORDER — LIDOCAINE HCL (PF) 1 % IJ SOLN: 30.0000 mL | INTRAMUSCULAR | Status: DC | PRN

## 2024-01-25 MED ORDER — LACTATED RINGERS IV SOLN: 500.0000 mL | INTRAVENOUS | Status: DC | PRN

## 2024-01-25 MED ORDER — SOD CITRATE-CITRIC ACID 500-334 MG/5ML PO SOLN: 30.0000 mL | ORAL | Status: DC | PRN

## 2024-01-25 MED ORDER — ACETAMINOPHEN 325 MG PO TABS: 650.0000 mg | ORAL_TABLET | ORAL | Status: DC | PRN

## 2024-01-25 MED ORDER — DIPHENHYDRAMINE HCL 50 MG/ML IJ SOLN: 12.5000 mg | INTRAMUSCULAR | Status: DC | PRN

## 2024-01-25 MED ORDER — COCONUT OIL OIL: 1.0000 | TOPICAL_OIL | Status: DC | PRN

## 2024-01-25 MED ORDER — BENZOCAINE-MENTHOL 20-0.5 % EX AERO
1.0000 | INHALATION_SPRAY | CUTANEOUS | Status: DC | PRN
  Administered 2024-01-25: 1 via TOPICAL
  Filled 2024-01-25: qty 56

## 2024-01-25 MED ORDER — DIBUCAINE (PERIANAL) 1 % EX OINT: 1.0000 | TOPICAL_OINTMENT | CUTANEOUS | Status: DC | PRN

## 2024-01-25 MED ORDER — MISOPROSTOL 25 MCG QUARTER TABLET
25.0000 ug | ORAL_TABLET | Freq: Once | ORAL | Status: AC
  Administered 2024-01-25: 25 ug via BUCCAL
  Filled 2024-01-25: qty 1

## 2024-01-25 MED ORDER — ACETAMINOPHEN 325 MG PO TABS
650.0000 mg | ORAL_TABLET | ORAL | Status: DC | PRN
  Administered 2024-01-26: 650 mg via ORAL
  Filled 2024-01-25 (×2): qty 2

## 2024-01-25 MED ORDER — IBUPROFEN 600 MG PO TABS
600.0000 mg | ORAL_TABLET | Freq: Four times a day (QID) | ORAL | Status: DC
  Administered 2024-01-25 – 2024-01-26 (×4): 600 mg via ORAL
  Filled 2024-01-25 (×4): qty 1

## 2024-01-25 MED ORDER — OXYTOCIN-SODIUM CHLORIDE 30-0.9 UT/500ML-% IV SOLN: 1.0000 m[IU]/min | INTRAVENOUS | Status: DC

## 2024-01-25 MED ORDER — ONDANSETRON HCL 4 MG/2ML IJ SOLN: 4.0000 mg | INTRAMUSCULAR | Status: DC | PRN

## 2024-01-25 MED ORDER — MISOPROSTOL 25 MCG QUARTER TABLET
25.0000 ug | ORAL_TABLET | Freq: Once | ORAL | Status: AC
  Administered 2024-01-25: 25 ug via VAGINAL
  Filled 2024-01-25: qty 1

## 2024-01-25 MED ORDER — LACTATED RINGERS IV SOLN: INTRAVENOUS | Status: DC

## 2024-01-25 MED ORDER — SIMETHICONE 80 MG PO CHEW: 80.0000 mg | CHEWABLE_TABLET | ORAL | Status: DC | PRN

## 2024-01-25 MED ORDER — FLEET ENEMA RE ENEM: 1.0000 | ENEMA | RECTAL | Status: DC | PRN

## 2024-01-25 MED ORDER — OXYTOCIN-SODIUM CHLORIDE 30-0.9 UT/500ML-% IV SOLN
2.5000 [IU]/h | INTRAVENOUS | Status: DC
  Administered 2024-01-25: 2.5 [IU]/h via INTRAVENOUS
  Filled 2024-01-25: qty 500

## 2024-01-25 MED ORDER — TETANUS-DIPHTH-ACELL PERTUSSIS 5-2.5-18.5 LF-MCG/0.5 IM SUSY: 0.5000 mL | PREFILLED_SYRINGE | Freq: Once | INTRAMUSCULAR | Status: DC

## 2024-01-25 MED ORDER — DIPHENHYDRAMINE HCL 25 MG PO CAPS: 25.0000 mg | ORAL_CAPSULE | Freq: Four times a day (QID) | ORAL | Status: DC | PRN

## 2024-01-25 NOTE — H&P (Signed)
 OB ADMISSION/ HISTORY & PHYSICAL:  Admission Date: 01/25/2024  7:44 AM  Admit Diagnosis: Gestational diabetes  Monique Patrick is a 38 y.o. female [phone] [redacted]w[redacted]d presenting for IOL for A1DM. Endorses active FM, denies LOF and vaginal bleeding. Single posterior fibroid noted on anatomy US.   History of current pregnancy: [id number]   Patient entered care with [facility] at 17 wks.   EDC 02/09/24 by 17 wk U/S   Anatomy scan:  complete w/ posterior placenta.   Antenatal testing: for A1DM started at 34 weeks Last evaluation: 37+5 wks vertex/ posterior, AFI 13.8/ EFW 7+4 (67%)  EFW >97%ile at 34 wks  Significant prenatal events:  Patient Active Problem List   Diagnosis Date Noted   GDM (gestational diabetes mellitus) 01/25/2024   AMA (advanced maternal age) multigravida 35+, third trimester 01/25/2024    Prenatal Labs: ABO, Rh: --/--/O POS (02/28 0830) Antibody: NEG (02/28 0830) Rubella: Immune (10/18 0000)  RPR: NON REACTIVE (02/28 0835)  HBsAg: Negative (10/18 0000)  HIV: Non-reactive (10/18 0000)  GTT: abnormal 3 hr GBS: Negative/-- (02/20 0000)  GC/CHL: neg/neg Genetics: declined Vaccines: Tdap: declined Influenza: declined   OB History  Gravida Para Term Preterm AB Living  4 4 4   4   SAB IAB Ectopic Multiple Live Births     0 4    # Outcome Date GA Lbr Len/2nd Weight Sex Type Anes PTL Lv  4 Term 01/25/24 [redacted]w[redacted]d 02:00 / 00:55 3840 g M Vag-Spont EPI  LIV  3 Term 08/25/18    F Vag-Spont   LIV  2 Term 08/08/14 [redacted]w[redacted]d 10:43 / 00:05 3912 g F Vag-Spont None  LIV  1 Term 05/28/11 [redacted]w[redacted]d   F Vag-Spont None  LIV     Birth Comments: No complications, born [place]    Medical / Surgical History: Past medical history:  Past Medical History:  Diagnosis Date   Allergy    Chronic headaches    GERD (gastroesophageal reflux disease)    Gestational diabetes     Past surgical history: History reviewed. No pertinent surgical history. Family History:  Family History  Problem Relation Age of  Onset   Diabetes Father    Diabetes Paternal Grandfather     Social History:  reports that she has never smoked. She has never used smokeless tobacco. She reports that she does not drink alcohol and does not use drugs.  Allergies: Patient has no known allergies.   Current Medications at time of admission:  Prior to Admission medications   Medication Sig Start Date End Date Taking? Authorizing Provider  cholecalciferol (VITAMIN D3) 25 MCG (1000 UNIT) tablet Take 1,000 Units by mouth daily.   Yes [provider]  Prenatal 27-1 MG TABS Take 1 tablet by mouth daily. 08/17/23  Yes Venora Maples, MD    Review of Systems: Constitutional: Negative   HENT: Negative   Eyes: Negative   Respiratory: Negative   Cardiovascular: Negative   Gastrointestinal: Negative  Genitourinary: neg for bloody show, neg for LOF   Musculoskeletal: Negative   Skin: Negative   Neurological: Negative   Endo/Heme/Allergies: Negative   Psychiatric/Behavioral: Negative    Physical Exam: VS: Blood pressure 110/76, pulse 85, temperature 99 F (37.2 C), temperature source Oral, resp. rate 18, height 5\' 6"  (1.676 m), weight 89.4 kg, SpO2 99%, unknown if currently breastfeeding. AAO x3, no signs of distress Cardiovascular: RRR Respiratory: Unlabored GU/GI: Abdomen gravid, non-tender, non-distended, active FM, vertex Extremities: trace edema, negative for pain, tenderness, and cords  Cervical  exam:Dilation: 0.5 Effacement (%): thick Station: -3 Exam by:: [place] FHR: baseline rate 140 / variability moderate / accelerations present / absent decelerations TOCO: irreg   Prenatal Transfer Tool  Maternal Diabetes: Yes:  Diabetes Type:  Diet controlled Genetic Screening: Declined Maternal Ultrasounds/Referrals: Normal Fetal Ultrasounds or other Referrals:  None Maternal Substance Abuse:  No Significant Maternal Medications:  None Significant Maternal Lab Results: Group B Strep  negative Number of Prenatal Visits:greater than 3 verified prenatal visits Maternal Vaccinations: declined all Other Comments:  None    Assessment: 38 y.o. [id number] [redacted]w[redacted]d  IOL for A1DM    -Q 4 hr CBG FHR category 1 GBS neg Pain management plan: epidural   Plan:  Admit to L&D Routine admission orders Epidural PRN Cytotec for ripening followed by Pitocin PRN Dr Normand Sloop notified of admission and plan of care  Roma Schanz DNP, CNM 01/25/2024 11:09 PM

## 2024-01-25 NOTE — Lactation Note (Signed)
 This note was copied from a baby's chart. Lactation Consultation Note  Patient Name: Monique Patrick [id number] Date: 01/25/2024 Age:36 hours Reason for consult: Initial assessment;Early term 37-38.6wks LC did not observe latch, Per MOB infant recently BF for 40 minutes and afterwards was given 10 mls of 20 kcal formula. Her feeding choice is breast and formula feeding infant. MOB plans to continue to latch infant first every feeding and afterwards supplement infant with formula. MOB is experienced with breastfeeding see maternal data below. MOB will continue to BF infant by cues, on demand, every 2-3 hours, skin to skin. MOB feels infant is breastfeeding well and doe not have any questions or concerns for LC at this time. LC discussed the importance of maternal rest, balance meals & snacks, hydration. MOB was made aware of O/P services, breastfeeding support groups, community resources, and our phone # for post-discharge questions.    Maternal Data Has patient been taught Hand Expression?: Yes Does the patient have breastfeeding experience prior to this delivery?: Yes How long did the patient breastfeed?: Per MOBm she BF her 1st and 2nd child both 15 months each.  Feeding Mother's Current Feeding Choice: Breast Milk and Formula Nipple Type: Regular  LATCH Score ( latch assessment was done by RN).  Latch: Repeated attempts needed to sustain latch, nipple held in mouth throughout feeding, stimulation needed to elicit sucking reflex.  Audible Swallowing: A few with stimulation  Type of Nipple: Everted at rest and after stimulation  Comfort (Breast/Nipple): Soft / non-tender  Hold (Positioning): Assistance needed to correctly position infant at breast and maintain latch.  LATCH Score: 7   Lactation Tools Discussed/Used    Interventions Interventions: Breast feeding basics reviewed;Skin to skin;DEBP;Education;LC Services brochure  Discharge Pump: DEBP;Personal  Consult  Status Consult Status: Follow-up Date: 01/26/24 Follow-up type: In-patient    Frederico Hamman 01/25/2024, 9:50 PM

## 2024-01-25 NOTE — Progress Notes (Signed)
 Subjective:    Comfortable with the epidural. Discussed amniotomy and pt agrees.   Objective:    VS: BP 110/76 (BP Location: Left Arm)   Pulse 85   Temp 99 F (37.2 C) (Oral)   Resp 18   Ht 5\' 6"  (1.676 m)   Wt 89.4 kg   SpO2 99%   Breastfeeding Unknown   BMI 31.81 kg/m  FHR : baseline 135 / variability moderate / accelerations present / absnt decelerations Toco: contractions every 2-3 minutes  Membranes: AROM, clear Dilation: 4 Effacement (%): 70 Station: -2 Presentation: Vertex Exam by:: Rhea Pink  Latest Reference Range & Units 01/25/24 09:15 01/25/24 13:40  Glucose-Capillary 70 - 99 mg/dL 87 95   Assessment/Plan:   38 y.o. G4P4004 [redacted]w[redacted]d IOL for A1DM    -continue Q4 hr CBG during labor  Labor: Progressing normally Fetal Wellbeing:  Category I Pain Control:  Epidural I/D:   GBS neg Anticipated MOD:  NSVD  Roma Schanz DNP, CNM 01/25/2024 11:22 PM

## 2024-01-25 NOTE — Progress Notes (Signed)
 Subjective:    Feeling pressure with contractions.   Objective:    VS: BP 110/76 (BP Location: Left Arm)   Pulse 85   Temp 99 F (37.2 C) (Oral)   Resp 18   Ht 5\' 6"  (1.676 m)   Wt 89.4 kg   SpO2 99%   Breastfeeding Unknown   BMI 31.81 kg/m  FHR : baseline 130 / variability moderate / accelerations present / variable decelerations Toco: contractions every 2-3 minutes  Membranes: AROM x2 hrs Dilation: 10 Dilation Complete Date: 01/25/24 Dilation Complete Time: 1631 Effacement (%): 70 Station: 0 Presentation: Vertex Exam by:: Rhea Pink   Assessment/Plan:   38 y.o. [id number] [redacted]w[redacted]d  Labor: Progressing normally Fetal Wellbeing:  Category II Pain Control:  Epidural I/D:   GBS neg Anticipated MOD:  NSVD  Roma Schanz DNP, CNM 01/25/2024 11:25 PM

## 2024-01-25 NOTE — Anesthesia Preprocedure Evaluation (Signed)
 Anesthesia Evaluation  Patient identified by MRN, date of birth, ID band Patient awake    Reviewed: Allergy & Precautions, Patient's Chart, lab work & pertinent test results  Airway Mallampati: II  TM Distance: >3 FB     Dental no notable dental hx.    Pulmonary neg pulmonary ROS   Pulmonary exam normal breath sounds clear to auscultation       Cardiovascular negative cardio ROS Normal cardiovascular exam Rhythm:Regular Rate:Normal     Neuro/Psych  Headaches  negative psych ROS   GI/Hepatic Neg liver ROS,GERD  Medicated,,  Endo/Other  diabetes, Well Controlled, Gestational  Obesity   Renal/GU negative Renal ROS  negative genitourinary   Musculoskeletal negative musculoskeletal ROS (+)    Abdominal  (+) + obese  Peds  Hematology  (+) Blood dyscrasia, anemia   Anesthesia Other Findings   Reproductive/Obstetrics (+) Pregnancy                             Anesthesia Physical Anesthesia Plan  ASA: 2  Anesthesia Plan: Epidural   Post-op Pain Management:    Induction:   PONV Risk Score and Plan:   Airway Management Planned: Natural Airway  Additional Equipment: Fetal Monitoring and None  Intra-op Plan:   Post-operative Plan:   Informed Consent: I have reviewed the patients History and Physical, chart, labs and discussed the procedure including the risks, benefits and alternatives for the proposed anesthesia with the patient or authorized representative who has indicated his/her understanding and acceptance.       Plan Discussed with: Anesthesiologist  Anesthesia Plan Comments:        Anesthesia Quick Evaluation

## 2024-01-25 NOTE — Anesthesia Procedure Notes (Signed)
 Epidural Patient location during procedure: OB Start time: 01/25/2024 1:55 PM End time: 01/25/2024 2:00 PM  Staffing Anesthesiologist: Mal Amabile, MD  Preanesthetic Checklist Completed: patient identified, IV checked, site marked, risks and benefits discussed, surgical consent, monitors and equipment checked, pre-op evaluation and timeout performed  Epidural Patient position: sitting Prep: DuraPrep and site prepped and draped Patient monitoring: continuous pulse ox and blood pressure Approach: midline Location: L3-L4 Injection technique: LOR air  Needle:  Needle type: Tuohy  Needle gauge: 17 G Needle length: 9 cm and 9 Needle insertion depth: 6 cm Catheter type: closed end flexible Catheter size: 19 Gauge Catheter at skin depth: 12 cm Test dose: negative  Assessment Events: blood not aspirated, no cerebrospinal fluid, injection not painful, no injection resistance, no paresthesia and negative IV test

## 2024-01-26 ENCOUNTER — Encounter (HOSPITAL_COMMUNITY): Payer: Self-pay | Admitting: Obstetrics and Gynecology

## 2024-01-26 LAB — CBC
HCT: 35.7 % — ABNORMAL LOW (ref 36.0–46.0)
Hemoglobin: 11.8 g/dL — ABNORMAL LOW (ref 12.0–15.0)
MCH: 29.9 pg (ref 26.0–34.0)
MCHC: 33.1 g/dL (ref 30.0–36.0)
MCV: 90.4 fL (ref 80.0–100.0)
Platelets: 231 10*3/uL (ref 150–400)
RBC: 3.95 MIL/uL (ref 3.87–5.11)
RDW: 14.6 % (ref 11.5–15.5)
WBC: 7.9 10*3/uL (ref 4.0–10.5)
nRBC: 0 % (ref 0.0–0.2)

## 2024-01-26 LAB — GLUCOSE, CAPILLARY: Glucose-Capillary: 113 mg/dL — ABNORMAL HIGH (ref 70–99)

## 2024-01-26 MED ORDER — IBUPROFEN 600 MG PO TABS: 600.0000 mg | ORAL_TABLET | Freq: Four times a day (QID) | ORAL | 0 refills | Status: AC

## 2024-01-26 NOTE — Lactation Note (Signed)
 This note was copied from a baby's chart. Lactation Consultation Note  Patient Name: Monique Patrick [id number] Date: 01/26/2024 Age:36 hours Reason for consult: Maternal discharge;Early term 37-38.6wks  P4, 37 wks, @ 25 hrs old. Encouraged mom to keep working on big mouth latch with baby and start with hand expression, and  hold breast compression through feeding. Discussed cluster feeding overnight/ early morning brings in our milk supply, shared expectations of milk coming in. Highlighted risk of engorgement. Discussed hand pump/express to soften breasts, motrin as anti-inflammatory, and ice packs for 10-20 minutes post feed/pumping if still over-full is the best treatments for inflamed/engorged breasts. Highlighted hand pump best for softening engorged breasts. Re-enforced LC services and milk storage.   Maternal Data Does the patient have breastfeeding experience prior to this delivery?: Yes  Feeding Mother's Current Feeding Choice: Breast Milk and Formula   Discharge Discharge Education: Engorgement and breast care  Consult Status Consult Status: Complete Date: 01/26/24    Idamae Lusher 01/26/2024, 6:43 PM

## 2024-01-26 NOTE — Discharge Summary (Signed)
 SVD OB Discharge Summary       Patient Name: Monique Patrick DOB: Jul 01, 1986 MRN: [id number]  Date of admission: 01/25/2024 Delivering MD: Rhea Pink B Date of delivery: 2/28 Type of delivery: SVD  Newborn Data: Sex: Baby female Circumcision: desire out pt with jewish MD Live born female  Birth Weight: 8 lb 7.5 oz (3840 g) APGAR: 8, 9  Newborn Delivery   Birth date/time: 01/25/2024 17:26:30 Delivery type: Vaginal, Spontaneous     Feeding: breast Infant being discharge to home with mother in stable condition.   Admitting diagnosis: GDM (gestational diabetes mellitus) [O24.419] Intrauterine pregnancy: [redacted]w[redacted]d     Secondary diagnosis:  Principal Problem:   GDM (gestational diabetes mellitus) Active Problems:   AMA (advanced maternal age) multigravida 35+, third trimester   SVD (spontaneous vaginal delivery)   Normal postpartum course                                Complications: None                                                              Intrapartum Procedures: spontaneous vaginal delivery Postpartum Procedures: none Complications-Operative and Postpartum:  1st degree perineal laceration Augmentation: AROM and Cytotec   History of Present Illness: Monique Patrick is a 38 y.o. female, [phone], who presents at [redacted]w[redacted]d weeks gestation. The patient has been followed at  [facility]  Her pregnancy has been complicated by:  Patient Active Problem List   Diagnosis Date Noted   SVD (spontaneous vaginal delivery) 01/26/2024   Normal postpartum course 01/26/2024   GDM (gestational diabetes mellitus) 01/25/2024   AMA (advanced maternal age) multigravida 35+, third trimester 01/25/2024     Active Ambulatory Problems    Diagnosis Date Noted   No Active Ambulatory Problems   Resolved Ambulatory Problems    Diagnosis Date Noted   Urticaria 10/30/2012   Encounter for supervision of normal pregnancy in multigravida 01/21/2014   Active  labor at term 08/08/2014   NSVD (normal spontaneous vaginal delivery) 08/08/2014   Hypotension 03/24/2015   Tension headache 01/22/2017   Gastroesophageal reflux disease with esophagitis 01/22/2017   Encounter for supervision of low-risk pregnancy 02/13/2018   Indication for care in labor or delivery 08/25/2018   Past Medical History:  Diagnosis Date   Allergy    Chronic headaches    GERD (gastroesophageal reflux disease)    Gestational diabetes      Hospital course:  Induction of Labor With Vaginal Delivery   38 y.o. yo [id number] at [redacted]w[redacted]d was admitted to the hospital 01/25/2024 for induction of labor.  Indication for induction: A1 DM.  Patient had an labor course complicated bypt was admitted on 2/28 for IOL for DMA1 diet controlled with normal CBG in pt, FBS was 113 PP, pt progressed with arom and cytotec to svd over 1st degree, ebl was , hgb 12.2-11.8, stable, desires to be early discharged today and meets criteria.  Membrane Rupture Time/Date: 2:31 PM,01/25/2024  Delivery Method:Vaginal, Spontaneous Operative Delivery:N/A Episiotomy: None Lacerations:  1st degree Details of delivery can be found in separate delivery note.  Patient had a postpartum course complicated bynone. Patient is discharged home 01/26/24.  Newborn Data:  Birth date:01/25/2024 Birth time:5:26 PM Gender:Female Living status:Living Apgars:8 ,9  Weight:3840 g Postpartum Day # 1 . Patient up ad lib, denies syncope or dizziness. Reports consuming regular diet without issues and denies N/V. Patient reports 0 bowel movement + passing flatus.  Denies issues with urination and reports bleeding is "lighter."  Patient is breastfeeding and reports going well.  Desires undecided for postpartum contraception.  Pain is being appropriately managed with use of po meds.    Physical exam  Vitals:   01/26/24 0100 01/26/24 0618 01/26/24 1002 01/26/24 1440  BP: 105/60 (!) 100/59 97/60 (!) 115/48  Pulse: 80 68 63 80  Resp: 18  18 18 18   Temp: 98.9 F (37.2 C) 97.6 F (36.4 C) 98.6 F (37 C) 97.6 F (36.4 C)  TempSrc: Oral Oral Oral Oral  SpO2: 99% 100% 98% 100%  Weight:      Height:       General: alert, cooperative, and no distress Lochia: appropriate Uterine Fundus: firm Perineum: approximate DVT Evaluation: No evidence of DVT seen on physical exam. Negative Homan's sign. No cords or calf tenderness. No significant calf/ankle edema.  Labs: Lab Results  Component Value Date   WBC 7.9 01/26/2024   HGB 11.8 (L) 01/26/2024   HCT 35.7 (L) 01/26/2024   MCV 90.4 01/26/2024   PLT 231 01/26/2024      Latest Ref Rng & Units 07/23/2015    3:23 PM  CMP  Glucose 65 - 99 mg/dL 79   BUN 7 - 25 mg/dL 9   Creatinine 4.09 - 8.11 mg/dL 9.14   Sodium 782 - 956 mmol/L 137   Potassium 3.5 - 5.3 mmol/L 4.2   Chloride 98 - 110 mmol/L 100   CO2 20 - 31 mmol/L 26   Calcium 8.6 - 10.2 mg/dL 8.9   Total Protein 6.1 - 8.1 g/dL 7.5   Total Bilirubin 0.2 - 1.2 mg/dL 0.4   Alkaline Phos 33 - 115 U/L 60   AST 10 - 30 U/L 32   ALT 6 - 29 U/L 39     Date of discharge: 01/26/2024 Discharge Diagnoses: Term Pregnancy-delivered Discharge instruction: per After Visit Summary and "Baby and Me Booklet".  After visit meds:   Activity:           unrestricted and pelvic rest Advance as tolerated. Pelvic rest for 6 weeks.  Diet:                routine Medications: PNV and Ibuprofen Postpartum contraception: Undecided Condition:  Pt discharge to home with baby in stable  Meds: Allergies as of 01/26/2024   No Known Allergies      Medication List     TAKE these medications    cholecalciferol 25 MCG (1000 UNIT) tablet Commonly known as: VITAMIN D3 Take 1,000 Units by mouth daily.   ibuprofen 600 MG tablet Commonly known as: ADVIL Take 1 tablet (600 mg total) by mouth every 6 (six) hours. Start taking on: January 27, 2024   Prenatal 27-1 MG Tabs Take 1 tablet by mouth daily.        Discharge Follow Up:    Follow-up Information     [facility]. Schedule an appointment as soon as possible for a visit in 6 week(s).   Specialty: Obstetrics and Gynecology Contact information: [address] [place] [address] [place] [address] [phone]                 [facility] CNM,  FNP-C, PMHNP-BC  [id number]  [place], [place] [address]  Cell: [phone]  Office Phone: [phone] Fax: [phone] 01/26/2024  6:45 PM

## 2024-01-26 NOTE — Progress Notes (Signed)
 PPD# 1 SVD w/ 1st degree Information for the patient's newborn:  Cinnamon, Morency [[id number]]  female  Baby Name [facility] Circumcision Yes   S:   Reports feeling good, happy to have had an epidural.  Tolerating PO fluid and solids No nausea or vomiting Bleeding is moderate, no clots Pain controlled with  PO meds Up ad lib / ambulatory / voiding w/o difficulty Feeding: Breast and formula     O:   VS: BP 99/60 (BP Location: Left Arm)   Pulse 87   Temp 99 F (37.2 C) (Oral)   Resp 18   Ht 5\' 6"  (1.676 m)   Wt 89.4 kg   SpO2 100%   Breastfeeding Unknown   BMI 31.81 kg/m   LABS:  Recent Labs    01/25/24 0835  WBC 5.8  HGB 12.2  PLT 244   Blood type: --/--/O POS (02/28 0830) Rubella: Immune (10/18 0000)                      I&O: Intake/Output      02/28 0701 03/01 0700   Urine (mL/kg/hr) 900   Blood 251   Total Output 1151   Net -1151       Urine Occurrence 1 x     Physical Exam: Alert and oriented X3 Lungs: Clear and unlabored Heart: regular rate and rhythm / no mumurs Abdomen: soft, non-tender, non-distended  Fundus: firm, non-tender, U-3 Perineum: healing, well-approximated Lochia: appropriate Extremities: no edema, negative for calf pain, tenderness, or cords    A:  PPD # 1  Normal exam  P:  Routine postpartum orders Lactation support PRN Anticipate D/C on PP day2 Plan reviewed w/ Dr. Tresa Moore, DNP, CNM 01/26/2024, 3:15 AM

## 2024-01-26 NOTE — Anesthesia Postprocedure Evaluation (Signed)
 Anesthesia Post Note  Patient: Monique Patrick  Procedure(s) Performed: AN AD HOC LABOR EPIDURAL     Patient location during evaluation: Mother Baby Anesthesia Type: Epidural Level of consciousness: awake and alert Pain management: pain level controlled Vital Signs Assessment: post-procedure vital signs reviewed and stable Respiratory status: spontaneous breathing, nonlabored ventilation and respiratory function stable Cardiovascular status: stable Postop Assessment: no headache, no backache, epidural receding and able to ambulate Anesthetic complications: no   No notable events documented.  Last Vitals:  Vitals:   01/26/24 0100 01/26/24 0618  BP: 105/60 (!) 100/59  Pulse: 80 68  Resp: 18 18  Temp: 37.2 C 36.4 C  SpO2: 99% 100%    Last Pain:  Vitals:   01/26/24 0621  TempSrc:   PainSc: 0-No pain   Pain Goal:                   Anber Mckiver

## 2024-02-02 ENCOUNTER — Telehealth (HOSPITAL_COMMUNITY): Payer: Self-pay

## 2024-02-02 NOTE — Telephone Encounter (Signed)
 02/02/2024 0936  Name: Monique Patrick MRN: [id number] DOB: December 16, 1985  Reason for Call:  Transition of Care Hospital Discharge Call  Contact Status: Patient Contact Status: Message  Language assistant needed:          Follow-Up Questions:    Inocente Salles Postnatal Depression Scale:  In the Past 7 Days:    PHQ2-9 Depression Scale:     Discharge Follow-up:    Post-discharge interventions: NA  Signature  Signe Colt
